# Patient Record
Sex: Male | Born: 1970 | Race: Black or African American | Hispanic: No | Marital: Married | Smoking: Never smoker
Health system: Southern US, Community
[De-identification: ages and names within clinical notes are randomized; demographics above are authoritative.]

## PROBLEM LIST (undated history)

## (undated) DIAGNOSIS — M7989 Other specified soft tissue disorders: Secondary | ICD-10-CM

## (undated) DIAGNOSIS — G4730 Sleep apnea, unspecified: Secondary | ICD-10-CM

## (undated) DIAGNOSIS — F32A Depression, unspecified: Secondary | ICD-10-CM

## (undated) DIAGNOSIS — K760 Fatty (change of) liver, not elsewhere classified: Secondary | ICD-10-CM

## (undated) DIAGNOSIS — E079 Disorder of thyroid, unspecified: Secondary | ICD-10-CM

## (undated) DIAGNOSIS — E039 Hypothyroidism, unspecified: Secondary | ICD-10-CM

## (undated) DIAGNOSIS — Z91018 Allergy to other foods: Secondary | ICD-10-CM

## (undated) DIAGNOSIS — F419 Anxiety disorder, unspecified: Secondary | ICD-10-CM

## (undated) DIAGNOSIS — E119 Type 2 diabetes mellitus without complications: Secondary | ICD-10-CM

## (undated) DIAGNOSIS — I10 Essential (primary) hypertension: Secondary | ICD-10-CM

## (undated) DIAGNOSIS — E559 Vitamin D deficiency, unspecified: Secondary | ICD-10-CM

## (undated) DIAGNOSIS — K219 Gastro-esophageal reflux disease without esophagitis: Secondary | ICD-10-CM

## (undated) DIAGNOSIS — J45909 Unspecified asthma, uncomplicated: Secondary | ICD-10-CM

## (undated) DIAGNOSIS — F329 Major depressive disorder, single episode, unspecified: Secondary | ICD-10-CM

## (undated) DIAGNOSIS — F319 Bipolar disorder, unspecified: Secondary | ICD-10-CM

## (undated) HISTORY — DX: Vitamin D deficiency, unspecified: E55.9

## (undated) HISTORY — DX: Unspecified asthma, uncomplicated: J45.909

## (undated) HISTORY — DX: Other specified soft tissue disorders: M79.89

## (undated) HISTORY — DX: Allergy to other foods: Z91.018

## (undated) HISTORY — PX: KNEE SURGERY: SHX244

## (undated) HISTORY — DX: Bipolar disorder, unspecified: F31.9

## (undated) HISTORY — DX: Hypothyroidism, unspecified: E03.9

## (undated) HISTORY — DX: Fatty (change of) liver, not elsewhere classified: K76.0

## (undated) HISTORY — DX: Gastro-esophageal reflux disease without esophagitis: K21.9

---

## 2001-02-17 ENCOUNTER — Emergency Department (HOSPITAL_COMMUNITY): Admission: EM | Admit: 2001-02-17 | Discharge: 2001-02-17 | Payer: Self-pay | Admitting: Internal Medicine

## 2001-03-09 ENCOUNTER — Ambulatory Visit (HOSPITAL_COMMUNITY): Admission: RE | Admit: 2001-03-09 | Discharge: 2001-03-09 | Payer: Self-pay | Admitting: Gastroenterology

## 2002-06-13 ENCOUNTER — Emergency Department (HOSPITAL_COMMUNITY): Admission: EM | Admit: 2002-06-13 | Discharge: 2002-06-13 | Payer: Self-pay | Admitting: Emergency Medicine

## 2002-06-13 ENCOUNTER — Encounter: Payer: Self-pay | Admitting: Emergency Medicine

## 2003-03-06 ENCOUNTER — Encounter: Payer: Self-pay | Admitting: Orthopedic Surgery

## 2003-03-06 ENCOUNTER — Ambulatory Visit (HOSPITAL_COMMUNITY): Admission: RE | Admit: 2003-03-06 | Discharge: 2003-03-06 | Payer: Self-pay | Admitting: Orthopedic Surgery

## 2003-05-16 ENCOUNTER — Encounter: Payer: Self-pay | Admitting: Internal Medicine

## 2003-05-16 ENCOUNTER — Encounter: Admission: RE | Admit: 2003-05-16 | Discharge: 2003-05-16 | Payer: Self-pay | Admitting: Internal Medicine

## 2003-06-17 ENCOUNTER — Ambulatory Visit (HOSPITAL_COMMUNITY): Admission: RE | Admit: 2003-06-17 | Discharge: 2003-06-17 | Payer: Self-pay | Admitting: Orthopedic Surgery

## 2003-07-03 ENCOUNTER — Encounter: Admission: RE | Admit: 2003-07-03 | Discharge: 2003-07-09 | Payer: Self-pay | Admitting: Orthopedic Surgery

## 2003-08-14 ENCOUNTER — Encounter: Payer: Self-pay | Admitting: Neurosurgery

## 2003-08-14 ENCOUNTER — Ambulatory Visit (HOSPITAL_COMMUNITY): Admission: RE | Admit: 2003-08-14 | Discharge: 2003-08-14 | Payer: Self-pay | Admitting: Neurosurgery

## 2003-08-27 ENCOUNTER — Encounter: Payer: Self-pay | Admitting: Neurosurgery

## 2003-08-27 ENCOUNTER — Encounter: Admission: RE | Admit: 2003-08-27 | Discharge: 2003-08-27 | Payer: Self-pay | Admitting: Neurosurgery

## 2003-09-11 ENCOUNTER — Encounter: Payer: Self-pay | Admitting: Neurosurgery

## 2003-09-11 ENCOUNTER — Encounter: Admission: RE | Admit: 2003-09-11 | Discharge: 2003-09-11 | Payer: Self-pay | Admitting: Neurosurgery

## 2003-10-09 ENCOUNTER — Encounter: Payer: Self-pay | Admitting: Internal Medicine

## 2003-10-09 ENCOUNTER — Ambulatory Visit (HOSPITAL_COMMUNITY): Admission: RE | Admit: 2003-10-09 | Discharge: 2003-10-09 | Payer: Self-pay | Admitting: Internal Medicine

## 2003-12-20 HISTORY — PX: KNEE SURGERY: SHX244

## 2004-08-24 ENCOUNTER — Ambulatory Visit (HOSPITAL_COMMUNITY): Admission: RE | Admit: 2004-08-24 | Discharge: 2004-08-24 | Payer: Self-pay | Admitting: Internal Medicine

## 2005-04-15 ENCOUNTER — Emergency Department (HOSPITAL_COMMUNITY): Admission: EM | Admit: 2005-04-15 | Discharge: 2005-04-16 | Payer: Self-pay | Admitting: Emergency Medicine

## 2005-04-17 ENCOUNTER — Emergency Department (HOSPITAL_COMMUNITY): Admission: EM | Admit: 2005-04-17 | Discharge: 2005-04-17 | Payer: Self-pay | Admitting: Emergency Medicine

## 2005-04-19 ENCOUNTER — Ambulatory Visit (HOSPITAL_COMMUNITY): Admission: RE | Admit: 2005-04-19 | Discharge: 2005-04-19 | Payer: Self-pay | Admitting: Urology

## 2005-09-13 IMAGING — CT CT ABDOMEN W/O CM
1 series · 15 of 32 positions shown, 19 images · non-contrast
Comparison: none

HISTORY: Left flank pain

[Series 2: stone_wo 5.0 b40f st · axial · 0.79mm/px · z∈[-490,-110]mm · 15 of 106 slices shown, 19 images]
[im 7/106  soft-tissue]
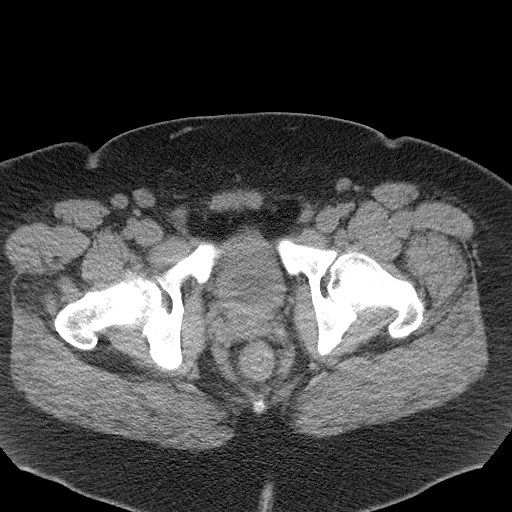
[im 7/106  bone]
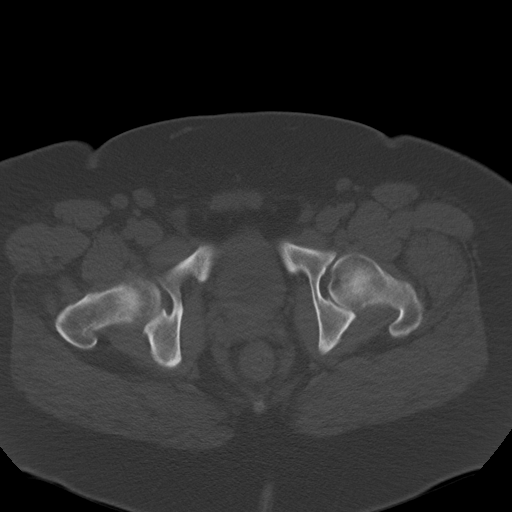
[im 14/106  soft-tissue]
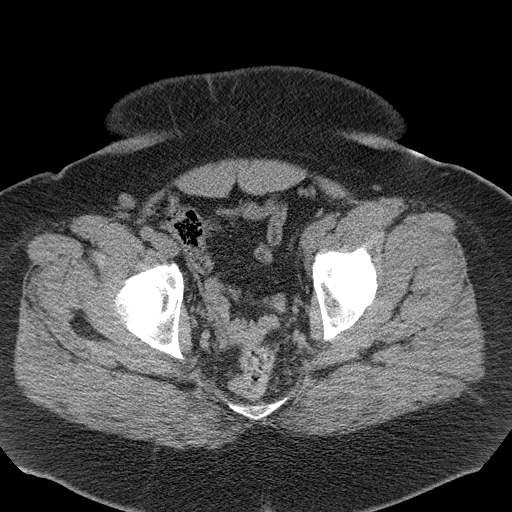
[im 21/106  soft-tissue]
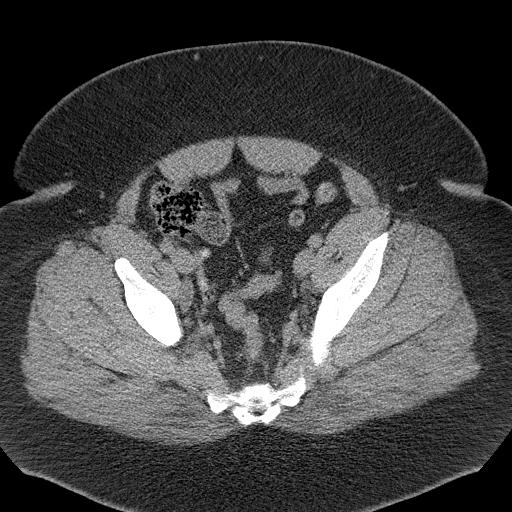
[im 31/106  soft-tissue]
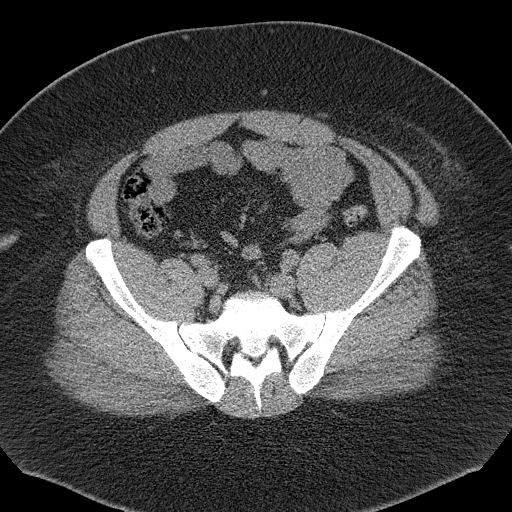
[im 38/106  soft-tissue]
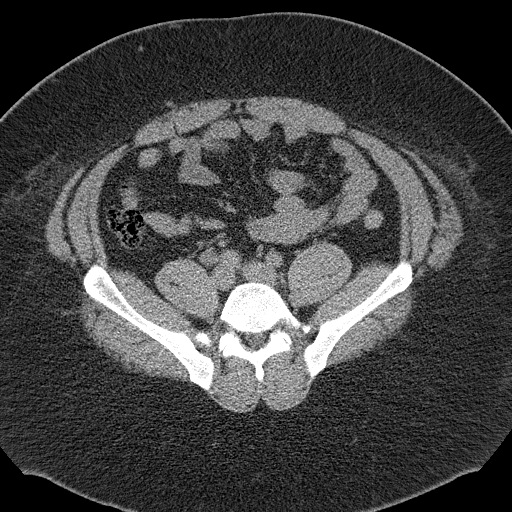
[im 45/106  soft-tissue]
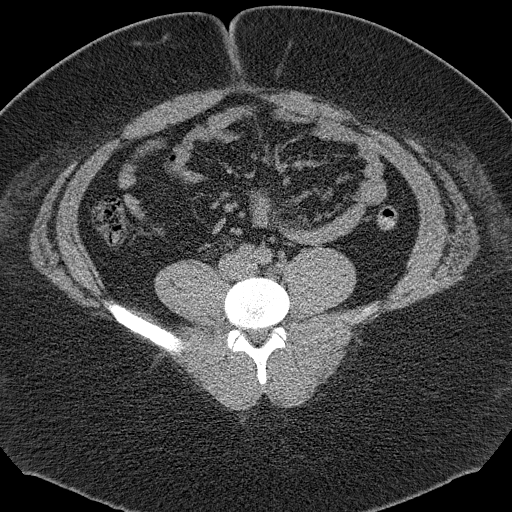
[im 55/106  soft-tissue]
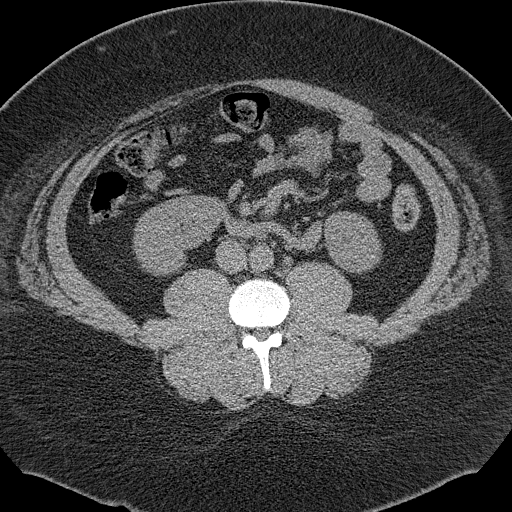
[im 61/106  soft-tissue]
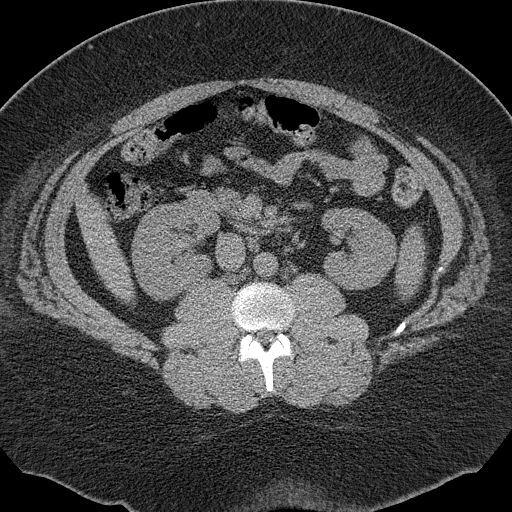
[im 68/106  soft-tissue]
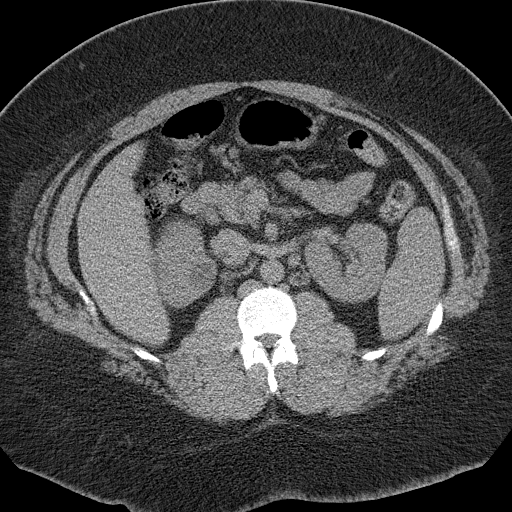
[im 68/106  bone]
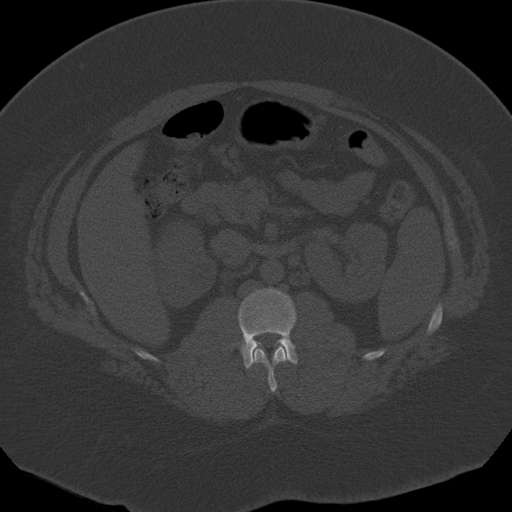
[im 75/106  soft-tissue]
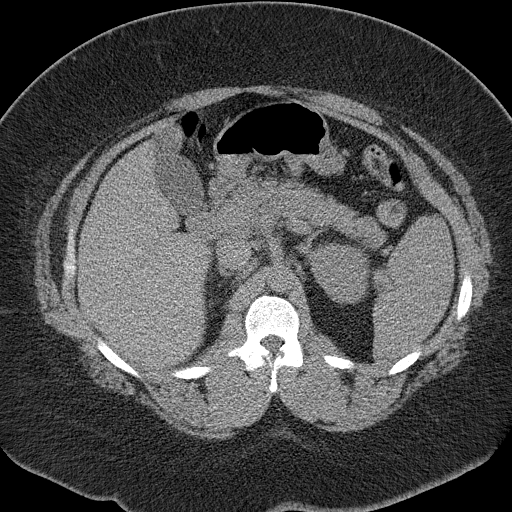
[im 85/106  soft-tissue]
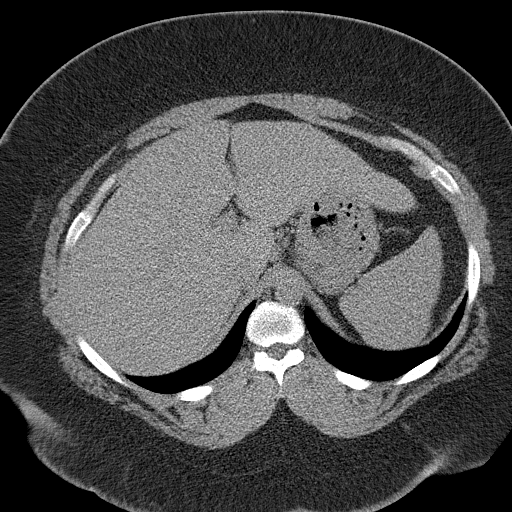
[im 92/106  soft-tissue]
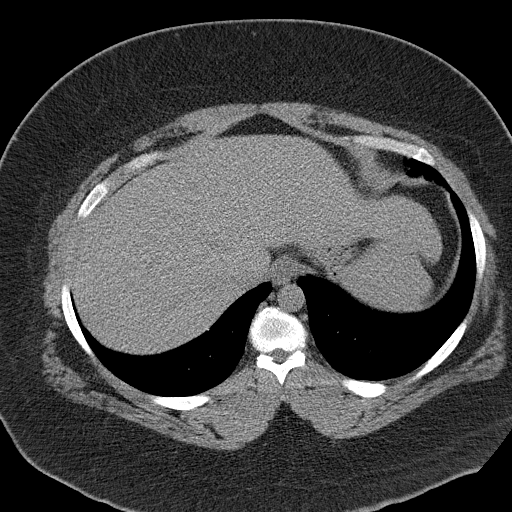
[im 92/106  lung]
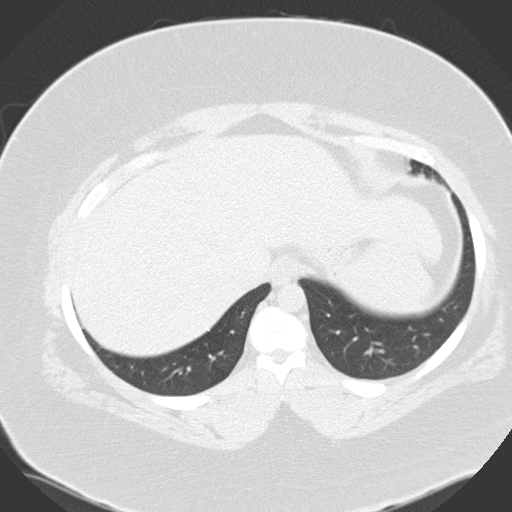
[im 95/106  lung]
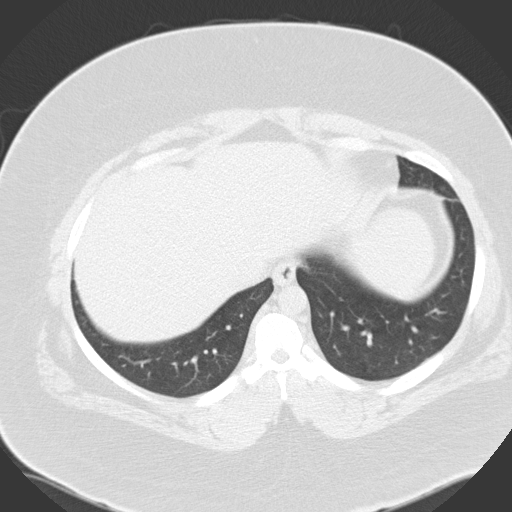
[im 99/106  soft-tissue]
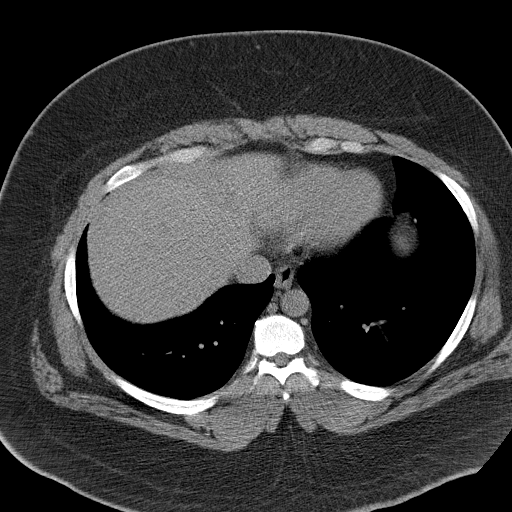
[im 99/106  lung]
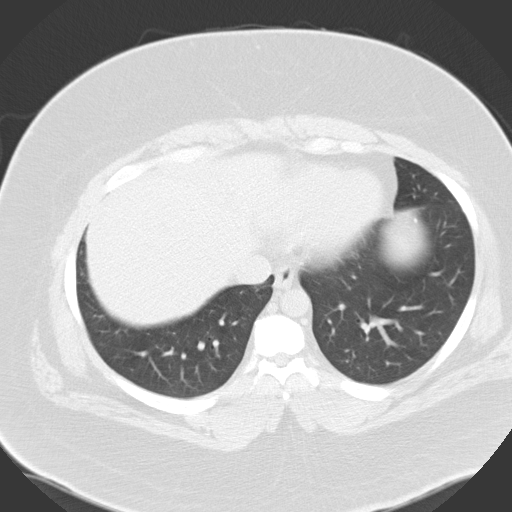
[im 102/106  lung]
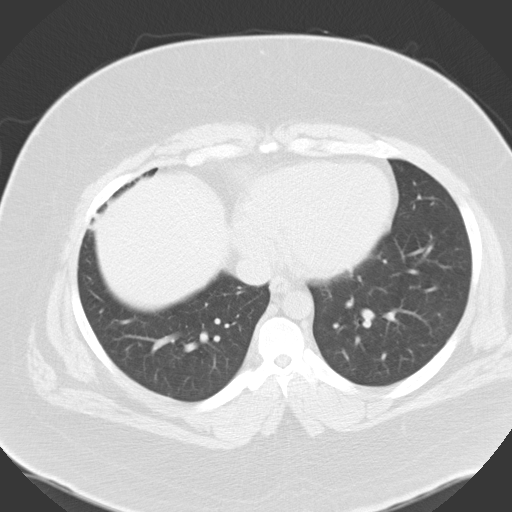

[15 of 32 positions shown; findings below may reference images not displayed]

CT ABDOMEN AND PELVIS WITHOUT CONTRAST:

Multidetector helical CT imaging of abdomen and pelvis performed using kidney
stone protocol. 
Neither oral nor intravenous contrast utilized for this indication.
No prior exam for comparison.

CT ABDOMEN:

Lung bases clear.
Image quality degraded by patient body habitus.
Vague low attenuation focus medial aspect of mid right kidney, 3.0 x 2.0 cm,
image 42, question cyst.
No gross evidence of urinary tract calcification, hydronephrosis, or ureteral
dilatation.
Minimally enlarged left periaortic lymph node, 1.4 x 1.1 cm, image 55.
Mild periportal adenopathy noted as well, largest node measuring 3.2 x 2.7 cm,
image 36.
Bowel loops and solid organs otherwise unremarkable for exam lacking IV and oral
contrast.
No ureteral calcification or dilatation.
IMPRESSION: Question right renal cyst, recommend followup ultrasound to confirm.
Periportal adenopathy of uncertain etiology. This can be seen with reactive
processes, metastatic disease, lymphoma, sarcoidosis, and other etiologies.
Correlation to patient's medical history recommended. If the patient has prior
outside exams, these would be of value in assessing stability. Remainder of
upper abdomen shows no significant abnormalities on non-contrast limited exam.
If prior studies cannot be obtained, recommend followup CT abdomen and pelvis
with IV and oral contrast to assess stability.

CT PELVIS:

Normal appendix.
No distal ureteral calcification or dilatation.
No pelvic mass, adenopathy, free fluid, or hernia.
Normal sized inguinal lymph nodes bilaterally.
IMPRESSION: No acute abnormalities.

## 2005-09-17 IMAGING — CT CT PELVIS W/ CM
1 of 5 series · 12 of 32 positions shown, 18 images · IV contrast (omnipaque)
Comparison: Renal ultrasound 08/24/04 and CT of the abdomen without contrast done 04/15/05.

CLINICAL DATA: Microhematuria and left flank pain.  Right renal cyst.
TECHNIQUE: Unenhanced images through the kidneys were obtained.  Subsequently, during the IV bolus injection of 150 cc of Omnipaque 300, multidetector helical CT imaging of the abdomen and pelvis was performed.  Oral contrast was given.

[Series 3: abd/pelvis 5.0 b30f · axial · 0.86mm/px · z∈[-384,+16]mm · 12 of 96 slices shown, 18 images]
[im 8/96  soft-tissue]
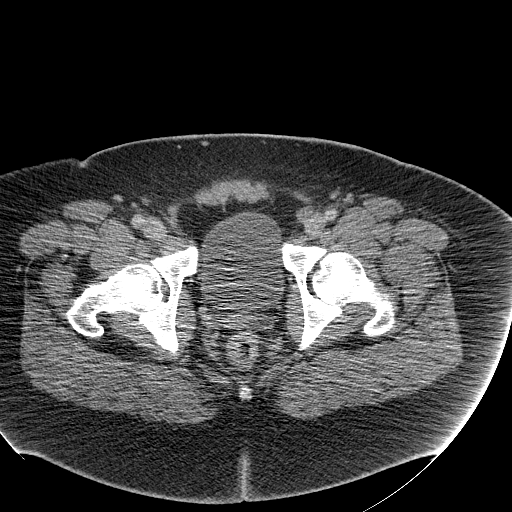
[im 8/96  bone]
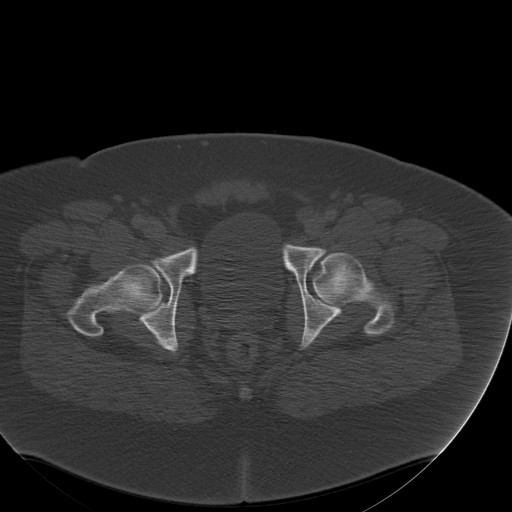
[im 15/96  soft-tissue]
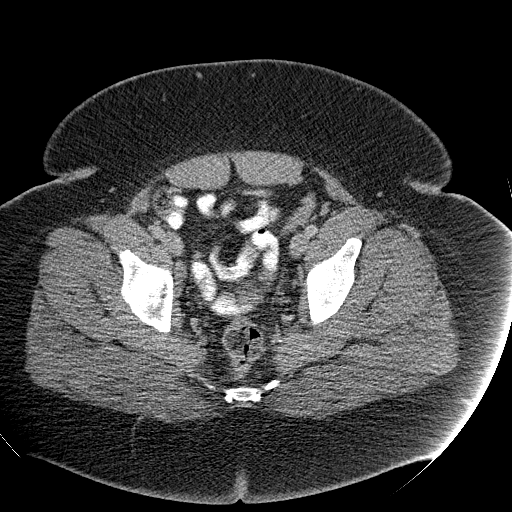
[im 22/96  soft-tissue]
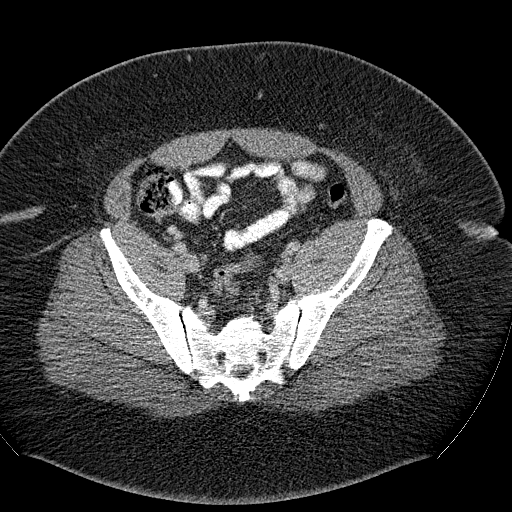
[im 30/96  soft-tissue]
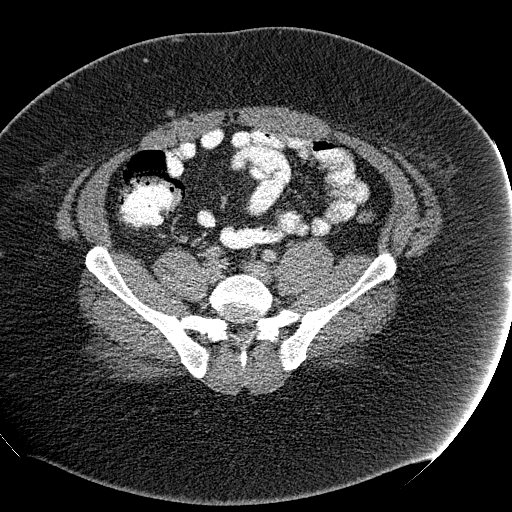
[im 37/96  soft-tissue]
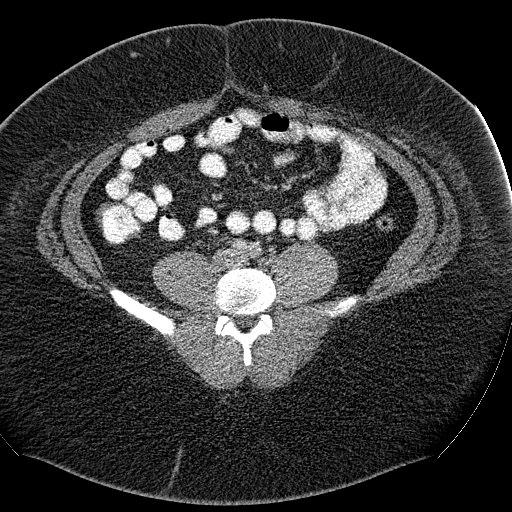
[im 44/96  soft-tissue]
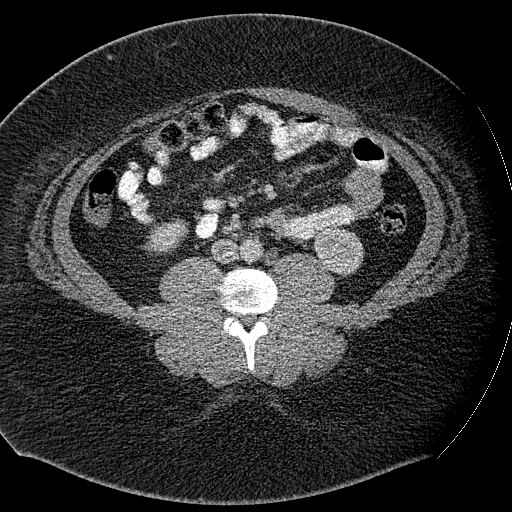
[im 52/96  soft-tissue]
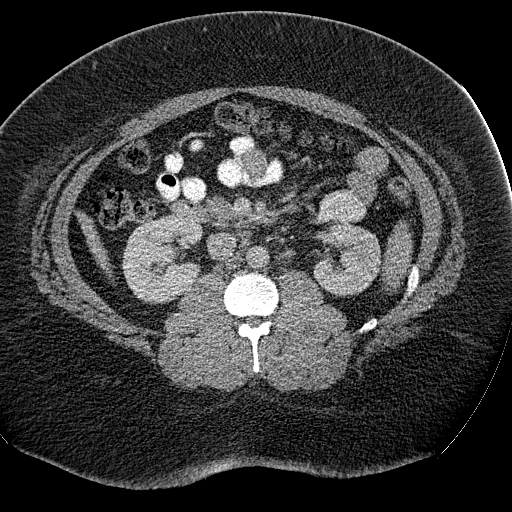
[im 59/96  soft-tissue]
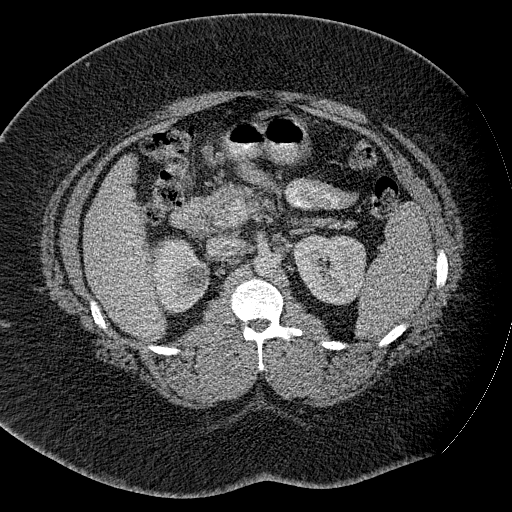
[im 66/96  soft-tissue]
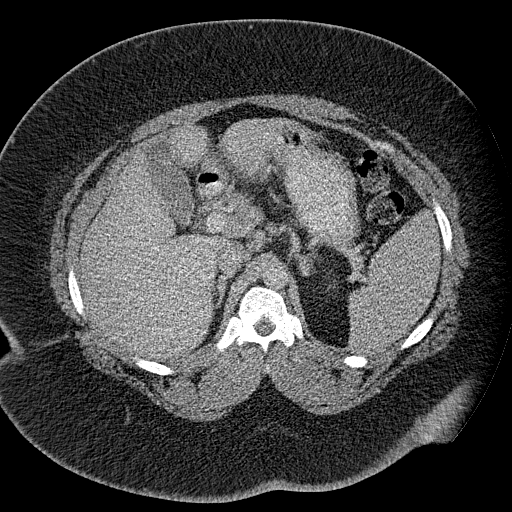
[im 66/96  lung]
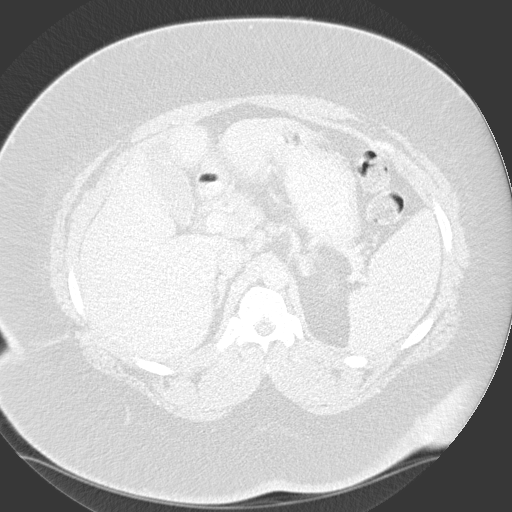
[im 66/96  bone]
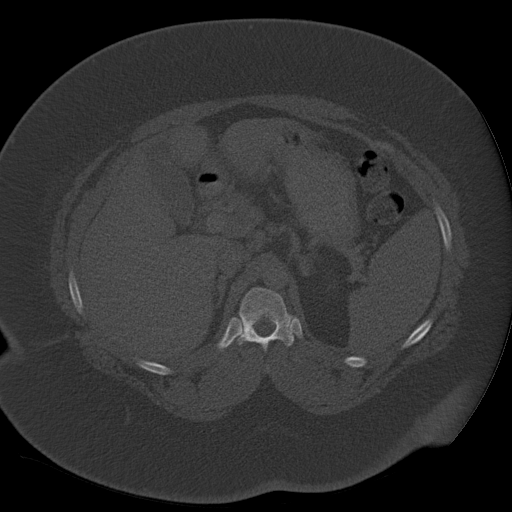
[im 74/96  soft-tissue]
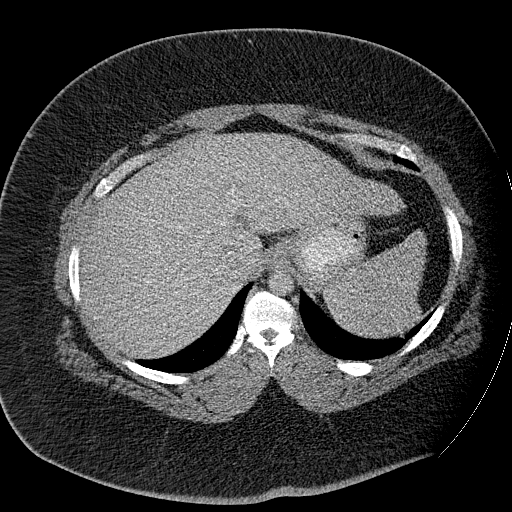
[im 74/96  lung]
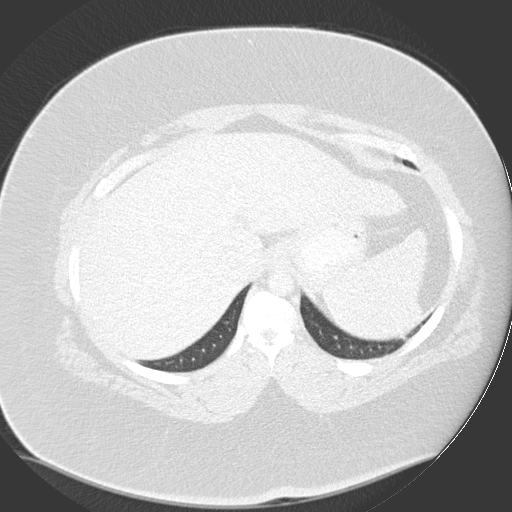
[im 81/96  soft-tissue]
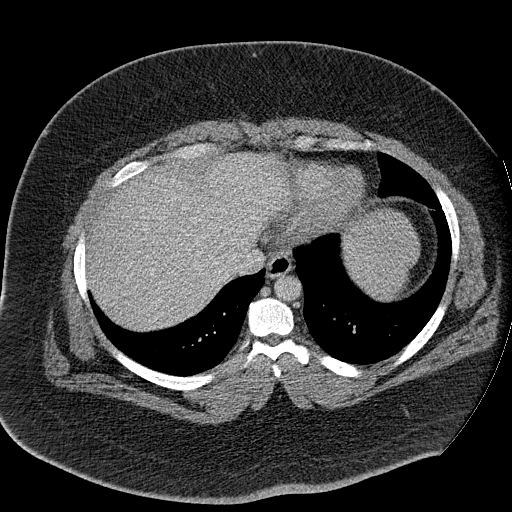
[im 81/96  lung]
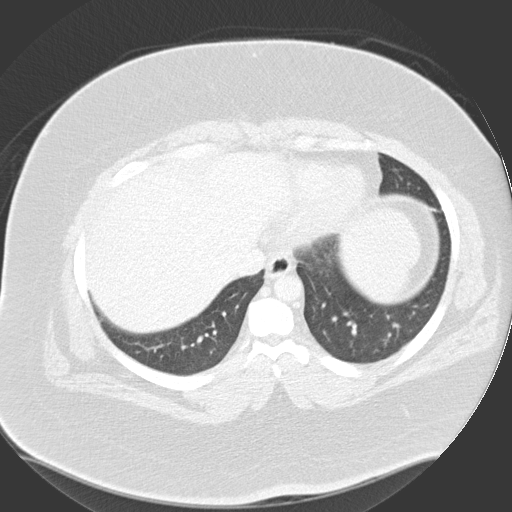
[im 88/96  soft-tissue]
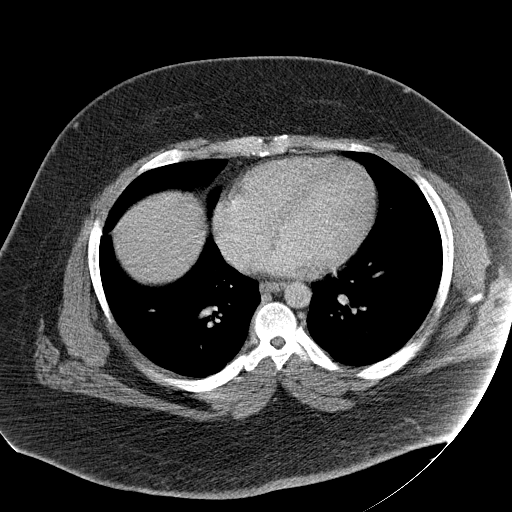
[im 88/96  lung]
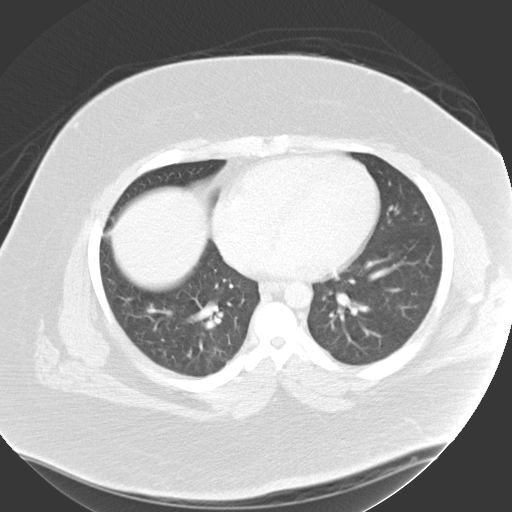

[12 of 32 positions shown; findings below may reference images not displayed]

CT OF THE ABDOMEN WITH AND WITHOUT CONTRAST:
The lung bases are clear.  There is no pleural effusion.  The precontrast images through the kidneys demonstrate no renal calcifications.  Posteriorly in the upper pole of the right kidney, there is a 2.5 x 2.8 cm low attenuation lesion.  This measures between 15 and 20 Hounsfield units before and after contrast administration and shows no apparent enhancement.  The patient had a cyst in the upper pole of the right kidney on an ultrasound done 08/24/04.  No enhancing renal masses are demonstrated.  There is no hydronephrosis.  Parenchymal detail is again limited by the patient?s size.  
The liver, spleen, adrenal glands, pancreas, and gallbladder demonstrate no abnormalities.  Again noted are several prominent lymph nodes in the porta hepatis and upper retroperitoneum.  These are unchanged from the recent unenhanced study of three days ago.  The largest node is in the Hideya caval space, measuring 2.2 cm in diameter on image 36.  No bowel lesions are identified.
IMPRESSION: 1.  Simple cyst in the upper pole of the right kidney demonstrates no abnormal enhancement.
2.  Nonspecific upper abdominal adenopathy as noted on recent unenhanced CT.  Once again, clinical correlation is necessary as this finding is nonspecific.  If etiology remains unknown clinically, followup CT in three to four months could be performed to assess stability. 
CT PELVIS WITH CONTRAST:
There is no pelvic mass, fluid collection, or inflammatory process.  No enlarged pelvic lymph nodes are seen.
IMPRESSION: No significant pelvic findings.

## 2006-02-07 ENCOUNTER — Emergency Department (HOSPITAL_COMMUNITY): Admission: EM | Admit: 2006-02-07 | Discharge: 2006-02-07 | Payer: Self-pay | Admitting: Family Medicine

## 2006-07-31 ENCOUNTER — Emergency Department (HOSPITAL_COMMUNITY): Admission: EM | Admit: 2006-07-31 | Discharge: 2006-07-31 | Payer: Self-pay | Admitting: Family Medicine

## 2006-10-03 ENCOUNTER — Emergency Department (HOSPITAL_COMMUNITY): Admission: EM | Admit: 2006-10-03 | Discharge: 2006-10-03 | Payer: Self-pay | Admitting: Family Medicine

## 2016-07-02 ENCOUNTER — Emergency Department (HOSPITAL_COMMUNITY): Payer: 59

## 2016-07-02 ENCOUNTER — Encounter (HOSPITAL_COMMUNITY): Payer: Self-pay | Admitting: Emergency Medicine

## 2016-07-02 ENCOUNTER — Observation Stay (HOSPITAL_COMMUNITY)
Admission: EM | Admit: 2016-07-02 | Discharge: 2016-07-04 | Disposition: A | Discharge status: Other Acute Inpatient Hospital | Payer: 59 | Attending: Internal Medicine | Admitting: Internal Medicine

## 2016-07-02 DIAGNOSIS — G4733 Obstructive sleep apnea (adult) (pediatric): Secondary | ICD-10-CM | POA: Insufficient documentation

## 2016-07-02 DIAGNOSIS — E039 Hypothyroidism, unspecified: Secondary | ICD-10-CM | POA: Diagnosis not present

## 2016-07-02 DIAGNOSIS — Z9989 Dependence on other enabling machines and devices: Secondary | ICD-10-CM | POA: Diagnosis not present

## 2016-07-02 DIAGNOSIS — G4730 Sleep apnea, unspecified: Secondary | ICD-10-CM | POA: Diagnosis present

## 2016-07-02 DIAGNOSIS — T424X2A Poisoning by benzodiazepines, intentional self-harm, initial encounter: Principal | ICD-10-CM | POA: Diagnosis present

## 2016-07-02 DIAGNOSIS — T413X2A Poisoning by local anesthetics, intentional self-harm, initial encounter: Secondary | ICD-10-CM | POA: Insufficient documentation

## 2016-07-02 DIAGNOSIS — E119 Type 2 diabetes mellitus without complications: Secondary | ICD-10-CM | POA: Diagnosis not present

## 2016-07-02 DIAGNOSIS — Z7984 Long term (current) use of oral hypoglycemic drugs: Secondary | ICD-10-CM | POA: Insufficient documentation

## 2016-07-02 DIAGNOSIS — T50902A Poisoning by unspecified drugs, medicaments and biological substances, intentional self-harm, initial encounter: Secondary | ICD-10-CM

## 2016-07-02 DIAGNOSIS — I10 Essential (primary) hypertension: Secondary | ICD-10-CM | POA: Diagnosis not present

## 2016-07-02 DIAGNOSIS — F3181 Bipolar II disorder: Secondary | ICD-10-CM | POA: Diagnosis not present

## 2016-07-02 HISTORY — DX: Disorder of thyroid, unspecified: E07.9

## 2016-07-02 HISTORY — DX: Major depressive disorder, single episode, unspecified: F32.9

## 2016-07-02 HISTORY — DX: Depression, unspecified: F32.A

## 2016-07-02 HISTORY — DX: Sleep apnea, unspecified: G47.30

## 2016-07-02 HISTORY — DX: Anxiety disorder, unspecified: F41.9

## 2016-07-02 HISTORY — DX: Essential (primary) hypertension: I10

## 2016-07-02 HISTORY — DX: Type 2 diabetes mellitus without complications: E11.9

## 2016-07-02 LAB — COMPREHENSIVE METABOLIC PANEL
ALT: 27 U/L (ref 17–63)
AST: 29 U/L (ref 15–41)
Albumin: 3.9 g/dL (ref 3.5–5.0)
Alkaline Phosphatase: 54 U/L (ref 38–126)
Anion gap: 7 (ref 5–15)
BILIRUBIN TOTAL: 1.2 mg/dL (ref 0.3–1.2)
BUN: 11 mg/dL (ref 6–20)
CHLORIDE: 102 mmol/L (ref 101–111)
CO2: 26 mmol/L (ref 22–32)
CREATININE: 0.76 mg/dL (ref 0.61–1.24)
Calcium: 8.9 mg/dL (ref 8.9–10.3)
Glucose, Bld: 148 mg/dL — ABNORMAL HIGH (ref 65–99)
Potassium: 4.3 mmol/L (ref 3.5–5.1)
Sodium: 135 mmol/L (ref 135–145)
TOTAL PROTEIN: 7.6 g/dL (ref 6.5–8.1)

## 2016-07-02 LAB — CBC
HCT: 46 % (ref 39.0–52.0)
Hemoglobin: 15.7 g/dL (ref 13.0–17.0)
MCH: 30.8 pg (ref 26.0–34.0)
MCHC: 34.1 g/dL (ref 30.0–36.0)
MCV: 90.4 fL (ref 78.0–100.0)
PLATELETS: 225 10*3/uL (ref 150–400)
RBC: 5.09 MIL/uL (ref 4.22–5.81)
RDW: 13.1 % (ref 11.5–15.5)
WBC: 9.4 10*3/uL (ref 4.0–10.5)

## 2016-07-02 LAB — BLOOD GAS, ARTERIAL
Acid-Base Excess: 0.5 mmol/L (ref 0.0–2.0)
BICARBONATE: 26.3 meq/L — AB (ref 20.0–24.0)
DELIVERY SYSTEMS: POSITIVE
Drawn by: 308601
O2 CONTENT: 12 L/min
O2 Saturation: 97.6 %
PCO2 ART: 48.3 mmHg — AB (ref 35.0–45.0)
PH ART: 7.355 (ref 7.350–7.450)
PO2 ART: 112 mmHg — AB (ref 80.0–100.0)
Patient temperature: 98.6
TCO2: 22.9 mmol/L (ref 0–100)

## 2016-07-02 LAB — ACETAMINOPHEN LEVEL: Acetaminophen (Tylenol), Serum: <10 ug/mL — ABNORMAL LOW (ref 10–30)

## 2016-07-02 LAB — SALICYLATE LEVEL

## 2016-07-02 LAB — ETHANOL

## 2016-07-02 MED ORDER — SODIUM CHLORIDE 0.9% FLUSH
3.0000 mL | Freq: Two times a day (BID) | INTRAVENOUS | Status: DC
  Administered 2016-07-02 – 2016-07-04 (×4): 3 mL via INTRAVENOUS

## 2016-07-02 MED ORDER — CHLORHEXIDINE GLUCONATE 0.12 % MT SOLN
15.0000 mL | Freq: Two times a day (BID) | OROMUCOSAL | Status: DC
  Administered 2016-07-03 – 2016-07-04 (×2): 15 mL via OROMUCOSAL
  Filled 2016-07-02: qty 15

## 2016-07-02 MED ORDER — CETYLPYRIDINIUM CHLORIDE 0.05 % MT LIQD
7.0000 mL | Freq: Two times a day (BID) | OROMUCOSAL | Status: DC
  Administered 2016-07-03 (×2): 7 mL via OROMUCOSAL

## 2016-07-02 MED ORDER — SODIUM CHLORIDE 0.9 % IV BOLUS (SEPSIS)
1000.0000 mL | Freq: Once | INTRAVENOUS | Status: AC
  Administered 2016-07-02: 1000 mL via INTRAVENOUS

## 2016-07-02 MED ORDER — ENOXAPARIN SODIUM 40 MG/0.4ML ~~LOC~~ SOLN: 40.0000 mg | SUBCUTANEOUS | Status: DC

## 2016-07-02 MED ORDER — ENOXAPARIN SODIUM 100 MG/ML ~~LOC~~ SOLN
90.0000 mg | Freq: Every day | SUBCUTANEOUS | Status: DC
  Administered 2016-07-03 – 2016-07-04 (×2): 90 mg via SUBCUTANEOUS
  Filled 2016-07-02 (×2): qty 1

## 2016-07-02 MED ORDER — SODIUM CHLORIDE 0.9 % IV SOLN
INTRAVENOUS | Status: DC
  Administered 2016-07-02 – 2016-07-03 (×2): via INTRAVENOUS

## 2016-07-02 NOTE — ED Notes (Signed)
Pt's 02 sat drops into 80's, snoring with pauses noted. RT at bedside for Bipap placement. Pt slower to open eyes at this time, no verbal response.

## 2016-07-02 NOTE — ED Notes (Signed)
Pt remains difficult to arouse but has not dropped oxygen saturation.

## 2016-07-02 NOTE — ED Provider Notes (Signed)
CSN: [id number]     Arrival date & time 07/02/16  2004 History   First MD Initiated Contact with Patient 07/02/16 2008     Chief Complaint  Patient presents with  . Drug Overdose     Patient is a 45 y.o. male presenting with Overdose. The history is provided by the patient and the EMS personnel. No language interpreter was used.  Drug Overdose   Vincent Ruiz is a 45 y.o. male who presents to the Emergency Department complaining of overdose.  Vincent Ruiz presents by EMS for evaluation for an overdose. His significant other found him lethargic this evening and called 911. He reports taking 15 20 mg Restoril about 6 PM today and attempted to kill himself. Per report patient had text messages on what to put it as a pituitary. He endorses SI. He denies any current complaints. He has a history of sleep apnea and uses CPAP at home.  Level V caveat due to psychiatric illness.  Past Medical History  Diagnosis Date  . Diabetes mellitus without complication (HCC)   . Hypertension   . Anxiety   . Depression   . Thyroid disease   . Sleep apnea    Past Surgical History  Procedure Laterality Date  . Knee surgery     History reviewed. No pertinent family history. Social History  Substance Use Topics  . Smoking status: Never Smoker   . Smokeless tobacco: None  . Alcohol Use: No    Review of Systems  Unable to perform ROS: Psychiatric disorder      Allergies  Penicillins  Home Medications   Prior to Admission medications   Medication Sig Start Date End Date Taking? Authorizing Provider  escitalopram (LEXAPRO) 20 MG tablet Take 20 mg by mouth daily.   Yes Historical Provider, MD  lamoTRIgine (LAMICTAL) 100 MG tablet Take 100 mg by mouth daily.   Yes Historical Provider, MD  levothyroxine (SYNTHROID, LEVOTHROID) 100 MCG tablet Take 100 mcg by mouth daily before breakfast.   Yes Historical Provider, MD  lisinopril-hydrochlorothiazide (PRINZIDE,ZESTORETIC) 10-12.5 MG tablet Take 1  tablet by mouth daily.   Yes Historical Provider, MD  metFORMIN (GLUCOPHAGE) 500 MG tablet Take 500 mg by mouth 2 (two) times daily with a meal.   Yes Historical Provider, MD  temazepam (RESTORIL) 15 MG capsule Take 15 mg by mouth at bedtime as needed for sleep.   Yes Historical Provider, MD  testosterone cypionate (DEPOTESTOSTERONE CYPIONATE) 200 MG/ML injection Inject 100 mg into the muscle once a week.   Yes Historical Provider, MD  traMADol (ULTRAM) 50 MG tablet Take 50 mg by mouth every 6 (six) hours as needed for moderate pain or severe pain.   Yes Historical Provider, MD  VITAMIN D, CHOLECALCIFEROL, PO Take 1 capsule by mouth daily.   Yes Historical Provider, MD   BP 134/97 mmHg  Pulse 79  Temp(Src) 98.4 F (36.9 C) (Oral)  Resp 20  Ht 6\' 2"  (1.88 m)  Wt 409 lb 2.8 oz (185.6 kg)  BMI 52.51 kg/m2  SpO2 98% Physical Exam  Constitutional: He is oriented to person, place, and time. He appears well-developed and well-nourished.  HENT:  Head: Normocephalic and atraumatic.  Cardiovascular: Normal rate and regular rhythm.   No murmur heard. Pulmonary/Chest: Effort normal and breath sounds normal. No respiratory distress.  Abdominal: Soft. There is no tenderness. There is no rebound and no guarding.  Musculoskeletal: He exhibits no tenderness.  There is a removable fracture boot to the left  foot and ankle.  Neurological: He is oriented to person, place, and time.  Drowsy but arouses to verbal stimuli.  Skin: Skin is warm and dry.  Psychiatric: He has a normal mood and affect. His behavior is normal.  Nursing note and vitals reviewed.   ED Course  Procedures (including critical care time) Labs Review Labs Reviewed  COMPREHENSIVE METABOLIC PANEL - Abnormal; Notable for the following:    Glucose, Bld 148 (*)    All other components within normal limits  ACETAMINOPHEN LEVEL - Abnormal; Notable for the following:    Acetaminophen (Tylenol), Serum <10 (*)    All other components  within normal limits  BLOOD GAS, ARTERIAL - Abnormal; Notable for the following:    pCO2 arterial 48.3 (*)    pO2, Arterial 112 (*)    Bicarbonate 26.3 (*)    All other components within normal limits  MRSA PCR SCREENING  ETHANOL  SALICYLATE LEVEL  CBC  URINE RAPID DRUG SCREEN, HOSP PERFORMED  CBC WITH DIFFERENTIAL/PLATELET  COMPREHENSIVE METABOLIC PANEL    Imaging Review Dg Chest Port 1 View  07/02/2016  CLINICAL DATA:  45 year old male status post overdose, suicide attempt. Initial encounter. EXAM: PORTABLE CHEST 1 VIEW COMPARISON:  CT Abdomen and Pelvis 04/19/2005 FINDINGS: Portable AP semi upright view at 2026 hours. Normal cardiac size and mediastinal contours. Visualized tracheal air column is within normal limits. Low normal lung volumes. Allowing for portable technique, the lungs are clear. No pneumothorax or pleural effusion. IMPRESSION: No acute cardiopulmonary abnormality. Electronically Signed   By: Odessa Fleming M.D.   On: 07/02/2016 20:47   I have personally reviewed and evaluated these images and lab results as part of my medical decision-making.   EKG Interpretation   Date/Time:  Saturday July 02 2016 20:29:30 EDT Ventricular Rate:  93 PR Interval:    QRS Duration: 86 QT Interval:  355 QTC Calculation: 442 R Axis:   30 Text Interpretation:  Sinus rhythm Confirmed by Lincoln Brigham [phone]) on  07/02/2016 8:32:21 PM      MDM   Final diagnoses:  Drug overdose, intentional self-harm, initial encounter (HCC)   Pt here following overdose on restoril.  Pt is somnolent on exam but awakens to verbal stimuli.  He has OSA and was started on his home CPAP given somnolence.  He does have some hypoxia as well and was started on supplemental oxygen.  Plan to admit to medicine service for observation and well decrease supplemental oxygen as tolerated.     Tilden Fossa, MD 07/03/16 0010

## 2016-07-02 NOTE — ED Notes (Signed)
02 by Bipap turned down to 10L by Dot BeenJessica,RT per MD

## 2016-07-02 NOTE — ED Notes (Signed)
Bed: RESB Expected date:  Expected time:  Means of arrival:  Comments: 38 OD

## 2016-07-02 NOTE — ED Notes (Signed)
Pt less responsive at this time, [place]. Pt currently on Bipap with 12L. Jessica RT at bedside for ABG

## 2016-07-02 NOTE — ED Notes (Addendum)
Pt transported from home by EMS, pt is currently on disability for fx leg, pt states he was depressed and tired of dealing with life. Pt admitted SI to [facility], EMS did read text messages on what to put in obituary. Pt reports to EMS he took Temazepam 15mg  x 20 at 1800 today in suicide attempt.  Pt reports he also took Ultram 50 mg x 2 at 1500, Levothyroxine 100mcg this am. Pt states he is suppose to be taking Lamictal as well but does not take a prescribed. Denies etoh/drugs/smoking

## 2016-07-02 NOTE — ED Notes (Signed)
Charge RN in ICU states room will be ready in , I will return call at this time

## 2016-07-02 NOTE — Progress Notes (Signed)
Rx Brief Lovenox note  Wt=185 kg, BMI=52, CrCl~210 ml/min  Rx adjusted Lovenox to 90mg  daily (~0.5 mg/kg) in pt with BMI>30  Thanks Lorenza EvangelistGreen, Maylen Waltermire R 07/02/2016 11:59 PM

## 2016-07-02 NOTE — H&P (Signed)
History and Physical    Vincent Ruiz ZOX:[id number] DOB: September 15, 1971 DOA: 07/02/2016  PCP: No primary care provider on file.   Patient coming from: Home.  Chief Complaint: Temazepam overdose.  HPI: Vincent Ruiz is a 45 y.o. male with medical history significant of type 2 diabetes, hypertension, morbid obesity, anxiety, depression, hypothyroidism, obstructive sleep apnea on CPAP who was brought to the emergency department via EMS after ingesting 300 mg of temazepam around 1800 with suicidal purposes. It is also reported that the patient took 100 mg of Ultram around 1500. Patient left the text messages, that were read by EMS, in what was supposed to be written in his orbituary. No further history available at this time.   ED Course: The patient was started on CPAP ventilation and has tolerated well.  Review of Systems: As per HPI otherwise 10 point review of systems negative.    Past Medical History  Diagnosis Date  . Diabetes mellitus without complication (HCC)   . Hypertension   . Anxiety   . Depression   . Thyroid disease   . Sleep apnea     Past Surgical History  Procedure Laterality Date  . Knee surgery       reports that he has never smoked. He does not have any smokeless tobacco history on file. He reports that he does not drink alcohol or use illicit drugs.  Allergies  Allergen Reactions  . Penicillins Other (See Comments)    Has patient had a PCN reaction causing immediate rash, facial/tongue/throat swelling, SOB or lightheadedness with hypotension: Yes Has patient had a PCN reaction causing severe rash involving mucus membranes or skin necrosis: Yes Has patient had a PCN reaction that required hospitalization Yes Has patient had a PCN reaction occurring within the last 10 years: No If all of the above answers are "NO", then may proceed with Cephalosporin use.     History reviewed. No pertinent family history.   Prior to Admission medications     Medication Sig Start Date End Date Taking? Authorizing Provider  escitalopram (LEXAPRO) 20 MG tablet Take 20 mg by mouth daily.   Yes Historical Provider, MD  lamoTRIgine (LAMICTAL) 100 MG tablet Take 100 mg by mouth daily.   Yes Historical Provider, MD  levothyroxine (SYNTHROID, LEVOTHROID) 100 MCG tablet Take 100 mcg by mouth daily before breakfast.   Yes Historical Provider, MD  lisinopril-hydrochlorothiazide (PRINZIDE,ZESTORETIC) 10-12.5 MG tablet Take 1 tablet by mouth daily.   Yes Historical Provider, MD  metFORMIN (GLUCOPHAGE) 500 MG tablet Take 500 mg by mouth 2 (two) times daily with a meal.   Yes Historical Provider, MD  temazepam (RESTORIL) 15 MG capsule Take 15 mg by mouth at bedtime as needed for sleep.   Yes Historical Provider, MD  testosterone cypionate (DEPOTESTOSTERONE CYPIONATE) 200 MG/ML injection Inject 100 mg into the muscle once a week.   Yes Historical Provider, MD  traMADol (ULTRAM) 50 MG tablet Take 50 mg by mouth every 6 (six) hours as needed for moderate pain or severe pain.   Yes Historical Provider, MD  VITAMIN D, CHOLECALCIFEROL, PO Take 1 capsule by mouth daily.   Yes Historical Provider, MD    Physical Exam: Filed Vitals:   07/02/16 2106 07/02/16 2130 07/02/16 2200 07/02/16 2235  BP: 130/82 117/75 112/88 116/79  Pulse: 96 85 86 85  Temp:      TempSrc:      Resp: SpO2: 99% 97% 96% 95%  Constitutional: Sedated. Filed Vitals:   07/02/16 2106 07/02/16 2130 07/02/16 2200 07/02/16 2235  BP: 130/82 117/75 112/88 116/79  Pulse: 96 85 86 85  Temp:      TempSrc:      Resp: 20 22 22 25   SpO2: 99% 97% 96% 95%   Eyes: PERRL, lids and conjunctivae normal ENMT: CPAP mask is on. Neck: normal, supple, no masses, no thyromegaly Respiratory: clear to auscultation bilaterally, no wheezing, no crackles. Normal respiratory effort. No accessory muscle use.  Cardiovascular: Regular rate and rhythm, no murmurs / rubs / gallops. No extremity edema. 2+  pedal pulses. No carotid bruits.  Abdomen: Obese, soft, Bowel sounds positive. no tenderness, no masses palpated.  Musculoskeletal: no clubbing / cyanosis. No joint deformity upper and lower extremities. Good ROM, no contractures. Relaxed muscle tone Skin: no rashes, lesions, ulcers. No induration Neurologic: Sedated. Psychiatric: Somnolent, awakes briefly with tactile stimulation, then goes back to sleep.   Labs on Admission: I have personally reviewed following labs and imaging studies  CBC:  Recent Labs Lab 07/02/16 2041  WBC 9.4  HGB 15.7  HCT 46.0  MCV 90.4  PLT 225   Basic Metabolic Panel:  Recent Labs Lab 07/02/16 2041  NA 135  K 4.3  CL 102  CO2 26  GLUCOSE 148*  BUN 11  CREATININE 0.76  CALCIUM 8.9   GFR: CrCl cannot be calculated (Unknown ideal weight.). Liver Function Tests:  Recent Labs Lab 07/02/16 2041  AST 29  ALT 27  ALKPHOS 54  BILITOT 1.2  PROT 7.6  ALBUMIN 3.9    Radiological Exams on Admission: Dg Chest Port 1 View  07/02/2016  CLINICAL DATA:  45 year old male status post overdose, suicide attempt. Initial encounter. EXAM: PORTABLE CHEST 1 VIEW COMPARISON:  CT Abdomen and Pelvis 04/19/2005 FINDINGS: Portable AP semi upright view at 2026 hours. Normal cardiac size and mediastinal contours. Visualized tracheal air column is within normal limits. Low normal lung volumes. Allowing for portable technique, the lungs are clear. No pneumothorax or pleural effusion. IMPRESSION: No acute cardiopulmonary abnormality. Electronically Signed   By: Odessa FlemingH  Hall M.D.   On: 07/02/2016 20:47    EKG: Independently reviewed.  Vent. rate 93 BPM PR interval * ms QRS duration 86 ms QT/QTc 355/442 ms P-R-T axes 53 30 13 Sinus rhythm  Assessment/Plan Principal Problem:   Intentional benzodiazepine overdose (HCC) Admit to stepdown/observation. Continue CPAP ventilation and supplemental oxygen. Consult behavioral health once the patient is more alert.  Active  Problems:   Hypertension Recent antihypertensives once the patient is more alert. Monitor blood pressure.    Type 2 diabetes mellitus (HCC) Resume metformin and carbohydrate modified diet once the patient is awake. CBG monitoring before meals.    Hypothyroidism Resume levothyroxine once the patient is awake. Check TSH level in the morning.    Sleep apnea on CPAP Continue CPAP ventilation.   CODE STATUS: Full code. DVT prophylaxis: Lovenox SQ. Disposition: Monitor closely and consult psychiatry once the patient is more alert. Consults called:  Admission status: Observation/stepdown   Bobette Moavid Manuel Georganna Maxson MD [facility] Pager [phone]   If 7PM-7AM, please contact night-coverage [email or web address] Password [email or web address]  07/02/2016, 11:00 PM

## 2016-07-03 DIAGNOSIS — R45851 Suicidal ideations: Secondary | ICD-10-CM

## 2016-07-03 DIAGNOSIS — I10 Essential (primary) hypertension: Secondary | ICD-10-CM

## 2016-07-03 DIAGNOSIS — F3181 Bipolar II disorder: Secondary | ICD-10-CM | POA: Diagnosis not present

## 2016-07-03 DIAGNOSIS — T424X2A Poisoning by benzodiazepines, intentional self-harm, initial encounter: Secondary | ICD-10-CM

## 2016-07-03 DIAGNOSIS — E039 Hypothyroidism, unspecified: Secondary | ICD-10-CM | POA: Diagnosis not present

## 2016-07-03 LAB — COMPREHENSIVE METABOLIC PANEL
ALBUMIN: 3.7 g/dL (ref 3.5–5.0)
ALK PHOS: 52 U/L (ref 38–126)
ALT: 25 U/L (ref 17–63)
AST: 24 U/L (ref 15–41)
Anion gap: 5 (ref 5–15)
BUN: 11 mg/dL (ref 6–20)
CHLORIDE: 105 mmol/L (ref 101–111)
CO2: 27 mmol/L (ref 22–32)
CREATININE: 0.84 mg/dL (ref 0.61–1.24)
Calcium: 8.6 mg/dL — ABNORMAL LOW (ref 8.9–10.3)
GFR calc non Af Amer: >60 mL/min (ref >60)
GLUCOSE: 172 mg/dL — AB (ref 65–99)
Potassium: 4.4 mmol/L (ref 3.5–5.1)
SODIUM: 137 mmol/L (ref 135–145)
Total Bilirubin: 1.3 mg/dL — ABNORMAL HIGH (ref 0.3–1.2)
Total Protein: 7.1 g/dL (ref 6.5–8.1)

## 2016-07-03 LAB — CBC WITH DIFFERENTIAL/PLATELET
BASOS ABS: 0 10*3/uL (ref 0.0–0.1)
BASOS PCT: 0 %
EOS ABS: 0.1 10*3/uL (ref 0.0–0.7)
EOS PCT: 2 %
HCT: 43.3 % (ref 39.0–52.0)
HEMOGLOBIN: 14.9 g/dL (ref 13.0–17.0)
LYMPHS ABS: 1.6 10*3/uL (ref 0.7–4.0)
Lymphocytes Relative: 21 %
MCH: 31.4 pg (ref 26.0–34.0)
MCHC: 34.4 g/dL (ref 30.0–36.0)
MCV: 91.2 fL (ref 78.0–100.0)
Monocytes Absolute: 0.8 10*3/uL (ref 0.1–1.0)
Monocytes Relative: 10 %
NEUTROS PCT: 67 %
Neutro Abs: 5.3 10*3/uL (ref 1.7–7.7)
PLATELETS: 220 10*3/uL (ref 150–400)
RBC: 4.75 MIL/uL (ref 4.22–5.81)
RDW: 13.3 % (ref 11.5–15.5)
WBC: 7.8 10*3/uL (ref 4.0–10.5)

## 2016-07-03 LAB — RAPID URINE DRUG SCREEN, HOSP PERFORMED
Amphetamines: NOT DETECTED
BARBITURATES: NOT DETECTED
Benzodiazepines: POSITIVE — AB
COCAINE: NOT DETECTED
OPIATES: NOT DETECTED
Tetrahydrocannabinol: NOT DETECTED

## 2016-07-03 LAB — TSH: TSH: 1.463 u[IU]/mL (ref 0.350–4.500)

## 2016-07-03 LAB — MRSA PCR SCREENING: MRSA by PCR: NEGATIVE

## 2016-07-03 MED ORDER — MENTHOL 3 MG MT LOZG
1.0000 | LOZENGE | OROMUCOSAL | Status: DC | PRN
  Administered 2016-07-03 – 2016-07-04 (×3): 3 mg via ORAL
  Filled 2016-07-03 (×2): qty 9

## 2016-07-03 MED ORDER — LISINOPRIL-HYDROCHLOROTHIAZIDE 10-12.5 MG PO TABS: 1.0000 | ORAL_TABLET | Freq: Every day | ORAL | Status: DC

## 2016-07-03 MED ORDER — ESCITALOPRAM OXALATE 20 MG PO TABS
20.0000 mg | ORAL_TABLET | Freq: Every day | ORAL | Status: DC
Start: 2016-07-03 — End: 2016-07-04
  Administered 2016-07-03 – 2016-07-04 (×2): 20 mg via ORAL
  Filled 2016-07-03 (×2): qty 1

## 2016-07-03 MED ORDER — LEVOTHYROXINE SODIUM 100 MCG PO TABS
100.0000 ug | ORAL_TABLET | Freq: Every day | ORAL | Status: DC
Start: 2016-07-04 — End: 2016-07-04
  Administered 2016-07-04: 100 ug via ORAL
  Filled 2016-07-03 (×2): qty 1

## 2016-07-03 MED ORDER — LAMOTRIGINE 100 MG PO TABS
100.0000 mg | ORAL_TABLET | Freq: Every day | ORAL | Status: DC
  Administered 2016-07-03 – 2016-07-04 (×2): 100 mg via ORAL
  Filled 2016-07-03 (×2): qty 1

## 2016-07-03 MED ORDER — HYDROCHLOROTHIAZIDE 12.5 MG PO CAPS
12.5000 mg | ORAL_CAPSULE | Freq: Every day | ORAL | Status: DC
  Administered 2016-07-03: 12.5 mg via ORAL
  Filled 2016-07-03: qty 1

## 2016-07-03 MED ORDER — LISINOPRIL 10 MG PO TABS
10.0000 mg | ORAL_TABLET | Freq: Every day | ORAL | Status: DC
  Administered 2016-07-03: 10 mg via ORAL
  Filled 2016-07-03: qty 1

## 2016-07-03 MED ORDER — TRAZODONE HCL 100 MG PO TABS: 100.0000 mg | ORAL_TABLET | Freq: Every evening | ORAL | Status: DC | PRN

## 2016-07-03 MED ORDER — METFORMIN HCL 500 MG PO TABS
500.0000 mg | ORAL_TABLET | Freq: Two times a day (BID) | ORAL | Status: DC
  Administered 2016-07-03 – 2016-07-04 (×2): 500 mg via ORAL
  Filled 2016-07-03 (×2): qty 1

## 2016-07-03 MED ORDER — CEPASTAT 14.5 MG MT LOZG
1.0000 | LOZENGE | OROMUCOSAL | Status: DC | PRN
  Filled 2016-07-03: qty 9

## 2016-07-03 NOTE — Progress Notes (Signed)
PROGRESS NOTE  Vincent Ruiz:[id number] DOB: 06/02/1971 DOA: 07/02/2016 PCP: No primary care provider on file.     Brief Narrative: 45 y.o. male with medical history significant of type 2 diabetes, hypertension, morbid obesity, anxiety, depression, hypothyroidism, obstructive sleep apnea on CPAP who was brought to the emergency department via EMS after ingesting 300 mg of temazepam around 1800 with suicidal purposes. It is also reported that the patient took 100 mg of Ultram around 1500. Patient left the text messages, that were read by EMS, in what was supposed to be written in his orbituary.   Assessment & Plan: Principal Problem:   Intentional benzodiazepine overdose (HCC) Active Problems:   Hypertension   Type 2 diabetes mellitus (HCC)   Hypothyroidism   Sleep apnea on CPAP    Intentional benzodiazepine overdose (HCC) - stable this morning, more alert. Admits to SI, denies attempting this in the past however has had suicidal ideation in the past - psychiatry consulted, appreciate input.   Hypertension - resume antihypertensives today  Type 2 diabetes mellitus (HCC) - Resume metformin  Hypothyroidism - Resume levothyroxine today   Sleep apnea on CPAP - Continue CPAP ventilation.    DVT prophylaxis: Lovenox Code Status: Full Family Communication: no family bedside Disposition Plan: stable medically by tomorrow.   Consultants:   Psychiatry   Procedures:   None   Antimicrobials:  None    Subjective: - alert, in no pain. CPAP on. Breathing comfortably.  Objective: Filed Vitals:   07/03/16 0400 07/03/16 0500 07/03/16 0600 07/03/16 0700  BP: 118/69  122/84 123/73  Pulse: 69 69 67 68  Temp: 97.3 F (36.3 C)     TempSrc: Axillary     Resp: Height:      Weight:      SpO2: 98% 100% 100% 100%    Intake/Output Summary (Last 24 hours) at 07/03/16 1049 Last data filed at 07/03/16 0529  Gross per 24 hour  Intake    750 ml  Output     801 ml  Net    -51 ml   Filed Weights   07/02/16 2349  Weight: 185.6 kg (409 lb 2.8 oz)    Examination: Constitutional: NAD. Obese Filed Vitals:   07/03/16 0400 07/03/16 0500 07/03/16 0600 07/03/16 0700  BP: 118/69  122/84 123/73  Pulse: 69 69 67 68  Temp: 97.3 F (36.3 C)     TempSrc: Axillary     Resp: Height:      Weight:      SpO2: 98% 100% 100% 100%   Eyes: PERRL ENMT: Mucous membranes are moist. No oropharyngeal exudates Respiratory: clear to auscultation bilaterally, no wheezing, no crackles. Normal respiratory effort. No accessory muscle use.  Cardiovascular: Regular rate and rhythm, no murmurs / rubs / gallops. No LE edema. 2+ pedal pulses. No carotid bruits.  Abdomen: no tenderness. Bowel sounds positive.  Musculoskeletal: no clubbing / cyanosis.     Data Reviewed: I have personally reviewed following labs and imaging studies  CBC:  Recent Labs Lab 07/02/16 2041 07/03/16 0307  WBC 9.4 7.8  NEUTROABS  --  5.3  HGB 15.7 14.9  HCT 46.0 43.3  MCV 90.4 91.2  PLT 225 220   Basic Metabolic Panel:  Recent Labs Lab 07/02/16 2041 07/03/16 0307  NA 135 137  K 4.3 4.4  CL 102 105  CO2 26 27  GLUCOSE 148* 172*  BUN 11 11  CREATININE 0.76 0.84  CALCIUM 8.9 8.6*   GFR: Estimated Creatinine Clearance: 200.3 mL/min (by C-G formula based on Cr of 0.84). Liver Function Tests:  Recent Labs Lab 07/02/16 2041 07/03/16 0307  AST 29 24  ALT 27 25  ALKPHOS 54 52  BILITOT 1.2 1.3*  PROT 7.6 7.1  ALBUMIN 3.9 3.7   No results for input(s): LIPASE, AMYLASE in the last 168 hours. No results for input(s): AMMONIA in the last 168 hours. Coagulation Profile: No results for input(s): INR, PROTIME in the last 168 hours. Cardiac Enzymes: No results for input(s): CKTOTAL, CKMB, CKMBINDEX, TROPONINI in the last 168 hours. BNP (last 3 results) No results for input(s): PROBNP in the last 8760 hours. HbA1C: No results for input(s): HGBA1C in the last 72  hours. CBG: No results for input(s): GLUCAP in the last 168 hours. Lipid Profile: No results for input(s): CHOL, HDL, LDLCALC, TRIG, CHOLHDL, LDLDIRECT in the last 72 hours. Thyroid Function Tests:  Recent Labs  07/03/16 0307  TSH 1.463   Anemia Panel: No results for input(s): VITAMINB12, FOLATE, FERRITIN, TIBC, IRON, RETICCTPCT in the last 72 hours. Urine analysis: No results found for: COLORURINE, APPEARANCEUR, LABSPEC, PHURINE, GLUCOSEU, HGBUR, BILIRUBINUR, KETONESUR, PROTEINUR, UROBILINOGEN, NITRITE, LEUKOCYTESUR Sepsis Labs: Invalid input(s): PROCALCITONIN, LACTICIDVEN  Recent Results (from the past 240 hour(s))  MRSA PCR Screening     Status: None   Collection Time: 07/02/16 11:57 PM  Result Value Ref Range Status   MRSA by PCR NEGATIVE NEGATIVE Final    Comment:        The GeneXpert MRSA Assay (FDA approved for NASAL specimens only), is one component of a comprehensive MRSA colonization surveillance program. It is not intended to diagnose MRSA infection nor to guide or monitor treatment for MRSA infections.       Radiology Studies: Dg Chest Port 1 View  07/02/2016  CLINICAL DATA:  45 year old male status post overdose, suicide attempt. Initial encounter. EXAM: PORTABLE CHEST 1 VIEW COMPARISON:  CT Abdomen and Pelvis 04/19/2005 FINDINGS: Portable AP semi upright view at 2026 hours. Normal cardiac size and mediastinal contours. Visualized tracheal air column is within normal limits. Low normal lung volumes. Allowing for portable technique, the lungs are clear. No pneumothorax or pleural effusion. IMPRESSION: No acute cardiopulmonary abnormality. Electronically Signed   By: Odessa FlemingH  Hall M.D.   On: 07/02/2016 20:47   Scheduled Meds: . antiseptic oral rinse  7 mL Mouth Rinse q12n4p  . chlorhexidine  15 mL Mouth Rinse BID  . enoxaparin (LOVENOX) injection  90 mg Subcutaneous Daily  . sodium chloride flush  3 mL Intravenous Q12H   Continuous Infusions:   Pamella Pertostin Drake Wuertz,  MD, PhD [facility] Pager [phone]  If 7PM-7AM, please contact night-coverage [email or web address] Password [email or web address] 07/03/2016, 10:49 AM

## 2016-07-03 NOTE — Consult Note (Signed)
[place] [place] Psychiatry Consult   Reason for Consult: intentional medication overdose, depressed Referring Physician:  Dr. Cruzita Lederer Patient Identification: Vincent Ruiz MRN:  [id number] Principal Diagnosis: Bipolar 2 disorder, major depressive episode [facility]) Diagnosis:   Patient Active Problem List   Diagnosis Date Noted  . Bipolar 2 disorder, major depressive episode ([place]) [F31.81] 07/03/2016    Priority: High  . Intentional benzodiazepine overdose ([place]) [T42.4X2A] 07/02/2016    Priority: High  . Hypertension [I10] 07/02/2016  . Morbid obesity ([place]) [E66.01] 07/02/2016  . Type 2 diabetes mellitus ([place]) [E11.9] 07/02/2016  . Hypothyroidism [E03.9] 07/02/2016  . Sleep apnea on CPAP [G47.30] 07/02/2016    Total Time spent with patient: 1 hour  Subjective:   Vincent Ruiz is a 45 y.o. male patient admitted with suicide attempt.  HPI:  Thanks for asking me to do a psychiatric consult on Vincent Ruiz, a 45 y.o. male with history of Bipolar depression, type 2 diabetes, hypertension, morbid obesity, anxiety, depression, hypothyroidism and obstructive sleep apnea on CPAP. Patient reports that he was brought to the emergency department via EMS after he intentionally attempted suicide by  ingesting 20-30 tablets of 81m of temazepam and 100 mg of Ultram around 1500. Patient left the text messages, that were read by EMS, in what was supposed to be written in his orbituary. Patient reports worsening depression since he lost his job, recently broke his foot, dealing with financial problem and life stressors. [facility] but denies prior inpatient admission. He denies delusional thinking, psychosis but unable to contract for safety.   Past Psychiatric History: as above  Risk to Self: Is patient at risk for suicide?: Yes Risk to Others:   Prior Inpatient Therapy:   Prior Outpatient Therapy:    Past Medical History:  Past Medical History  Diagnosis Date   . Diabetes mellitus without complication ([place]   . Hypertension   . Anxiety   . Depression   . Thyroid disease   . Sleep apnea     Past Surgical History  Procedure Laterality Date  . Knee surgery     Family History: History reviewed. No pertinent family history. Family Psychiatric  History: Social History:  History  Alcohol Use No     History  Drug Use No    Social History   Social History  . Marital Status: Married    Spouse Name: N/A  . Number of Children: N/A  . Years of Education: N/A   Social History Main Topics  . Smoking status: Never Smoker   . Smokeless tobacco: None  . Alcohol Use: No  . Drug Use: No  . Sexual Activity: Not Asked   Other Topics Concern  . None   Social History Narrative  . None   Additional Social History:    Allergies:   Allergies  Allergen Reactions  . Penicillins Other (See Comments)    Has patient had a PCN reaction causing immediate rash, facial/tongue/throat swelling, SOB or lightheadedness with hypotension: Yes Has patient had a PCN reaction causing severe rash involving mucus membranes or skin necrosis: Yes Has patient had a PCN reaction that required hospitalization Yes Has patient had a PCN reaction occurring within the last 10 years: No If all of the above answers are "NO", then may proceed with Cephalosporin use.     Labs:  Results for orders placed or performed during the hospital encounter of 07/02/16 (from the past 48 hour(s))  Comprehensive metabolic panel  Status: Abnormal   Collection Time: 07/02/16  8:41 PM  Result Value Ref Range   Sodium 135 135 - 145 mmol/L   Potassium 4.3 3.5 - 5.1 mmol/L   Chloride 102 101 - 111 mmol/L   CO2 26 22 - 32 mmol/L   Glucose, Bld 148 (H) 65 - 99 mg/dL   BUN 11 6 - 20 mg/dL   Creatinine, Ser 0.76 0.61 - 1.24 mg/dL   Calcium 8.9 8.9 - 10.3 mg/dL   Total Protein 7.6 6.5 - 8.1 g/dL   Albumin 3.9 3.5 - 5.0 g/dL   AST 29 15 - 41 U/L   ALT 27 17 - 63 U/L   Alkaline  Phosphatase 54 38 - 126 U/L   Total Bilirubin 1.2 0.3 - 1.2 mg/dL   GFR calc non Af Amer >60 >60 mL/min   GFR calc Af Amer >60 >60 mL/min    Comment: (NOTE) The eGFR has been calculated using the CKD EPI equation. This calculation has not been validated in all clinical situations. eGFR's persistently <60 mL/min signify possible Chronic Kidney Disease.    Anion gap 7 5 - 15  Ethanol     Status: None   Collection Time: 07/02/16  8:41 PM  Result Value Ref Range   Alcohol, Ethyl (B) <5 <5 mg/dL    Comment:        LOWEST DETECTABLE LIMIT FOR SERUM ALCOHOL IS 5 mg/dL FOR MEDICAL PURPOSES ONLY   Salicylate level     Status: None   Collection Time: 07/02/16  8:41 PM  Result Value Ref Range   Salicylate Lvl <5.0 2.8 - 30.0 mg/dL  Acetaminophen level     Status: Abnormal   Collection Time: 07/02/16  8:41 PM  Result Value Ref Range   Acetaminophen (Tylenol), Serum <10 (L) 10 - 30 ug/mL    Comment:        THERAPEUTIC CONCENTRATIONS VARY SIGNIFICANTLY. A RANGE OF 10-30 ug/mL MAY BE AN EFFECTIVE CONCENTRATION FOR MANY PATIENTS. HOWEVER, SOME ARE BEST TREATED AT CONCENTRATIONS OUTSIDE THIS RANGE. ACETAMINOPHEN CONCENTRATIONS >150 ug/mL AT 4 HOURS AFTER INGESTION AND >50 ug/mL AT 12 HOURS AFTER INGESTION ARE OFTEN ASSOCIATED WITH TOXIC REACTIONS.   cbc     Status: None   Collection Time: 07/02/16  8:41 PM  Result Value Ref Range   WBC 9.4 4.0 - 10.5 K/uL   RBC 5.09 4.22 - 5.81 MIL/uL   Hemoglobin 15.7 13.0 - 17.0 g/dL   HCT 46.0 39.0 - 52.0 %   MCV 90.4 78.0 - 100.0 fL   MCH 30.8 26.0 - 34.0 pg   MCHC 34.1 30.0 - 36.0 g/dL   RDW 13.1 11.5 - 15.5 %   Platelets 225 150 - 400 K/uL  Blood gas, arterial     Status: Abnormal   Collection Time: 07/02/16  9:00 PM  Result Value Ref Range   O2 Content 12.0 L/min   Delivery systems CONTINUOUS POSITIVE AIRWAY PRESSURE    pH, Arterial 7.355 7.350 - 7.450   pCO2 arterial 48.3 (H) 35.0 - 45.0 mmHg   pO2, Arterial 112 (H) 80.0 - 100.0  mmHg   Bicarbonate 26.3 (H) 20.0 - 24.0 mEq/L   TCO2 22.9 0 - 100 mmol/L   Acid-Base Excess 0.5 0.0 - 2.0 mmol/L   O2 Saturation 97.6 %   Patient temperature 98.6    Collection site RIGHT RADIAL    Drawn by [id number]    Sample type ARTERIAL DRAW    Allens test (pass/fail) PASS  PASS  MRSA PCR Screening     Status: None   Collection Time: 07/02/16 11:57 PM  Result Value Ref Range   MRSA by PCR NEGATIVE NEGATIVE    Comment:        The GeneXpert MRSA Assay (FDA approved for NASAL specimens only), is one component of a comprehensive MRSA colonization surveillance program. It is not intended to diagnose MRSA infection nor to guide or monitor treatment for MRSA infections.   Rapid urine drug screen (hospital performed)     Status: Abnormal   Collection Time: 07/03/16  1:51 AM  Result Value Ref Range   Opiates NONE DETECTED NONE DETECTED   Cocaine NONE DETECTED NONE DETECTED   Benzodiazepines POSITIVE (A) NONE DETECTED   Amphetamines NONE DETECTED NONE DETECTED   Tetrahydrocannabinol NONE DETECTED NONE DETECTED   Barbiturates NONE DETECTED NONE DETECTED    Comment:        DRUG SCREEN FOR MEDICAL PURPOSES ONLY.  IF CONFIRMATION IS NEEDED FOR ANY PURPOSE, NOTIFY LAB WITHIN 5 DAYS.        LOWEST DETECTABLE LIMITS FOR URINE DRUG SCREEN Drug Class       Cutoff (ng/mL) Amphetamine      1000 Barbiturate      200 Benzodiazepine   262 Tricyclics       035 Opiates          300 Cocaine          300 THC              50   CBC WITH DIFFERENTIAL     Status: None   Collection Time: 07/03/16  3:07 AM  Result Value Ref Range   WBC 7.8 4.0 - 10.5 K/uL   RBC 4.75 4.22 - 5.81 MIL/uL   Hemoglobin 14.9 13.0 - 17.0 g/dL   HCT 43.3 39.0 - 52.0 %   MCV 91.2 78.0 - 100.0 fL   MCH 31.4 26.0 - 34.0 pg   MCHC 34.4 30.0 - 36.0 g/dL   RDW 13.3 11.5 - 15.5 %   Platelets 220 150 - 400 K/uL   Neutrophils Relative % 67 %   Neutro Abs 5.3 1.7 - 7.7 K/uL   Lymphocytes Relative 21 %   Lymphs Abs 1.6  0.7 - 4.0 K/uL   Monocytes Relative 10 %   Monocytes Absolute 0.8 0.1 - 1.0 K/uL   Eosinophils Relative 2 %   Eosinophils Absolute 0.1 0.0 - 0.7 K/uL   Basophils Relative 0 %   Basophils Absolute 0.0 0.0 - 0.1 K/uL  Comprehensive metabolic panel     Status: Abnormal   Collection Time: 07/03/16  3:07 AM  Result Value Ref Range   Sodium 137 135 - 145 mmol/L   Potassium 4.4 3.5 - 5.1 mmol/L   Chloride 105 101 - 111 mmol/L   CO2 27 22 - 32 mmol/L   Glucose, Bld 172 (H) 65 - 99 mg/dL   BUN 11 6 - 20 mg/dL   Creatinine, Ser 0.84 0.61 - 1.24 mg/dL   Calcium 8.6 (L) 8.9 - 10.3 mg/dL   Total Protein 7.1 6.5 - 8.1 g/dL   Albumin 3.7 3.5 - 5.0 g/dL   AST 24 15 - 41 U/L   ALT 25 17 - 63 U/L   Alkaline Phosphatase 52 38 - 126 U/L   Total Bilirubin 1.3 (H) 0.3 - 1.2 mg/dL   GFR calc non Af Amer >60 >60 mL/min   GFR calc Af Amer >60 >60 mL/min  Comment: (NOTE) The eGFR has been calculated using the CKD EPI equation. This calculation has not been validated in all clinical situations. eGFR's persistently <60 mL/min signify possible Chronic Kidney Disease.    Anion gap 5 5 - 15  TSH     Status: None   Collection Time: 07/03/16  3:07 AM  Result Value Ref Range   TSH 1.463 0.350 - 4.500 uIU/mL    Current Facility-Administered Medications  Medication Dose Route Frequency Provider Last Rate Last Dose  . antiseptic oral rinse (CPC / CETYLPYRIDINIUM CHLORIDE 0.05%) solution 7 mL  7 mL Mouth Rinse q12n4p Reubin Milan, MD      . chlorhexidine (PERIDEX) 0.12 % solution 15 mL  15 mL Mouth Rinse BID Reubin Milan, MD   15 mL at 07/03/16 1013  . enoxaparin (LOVENOX) injection 90 mg  90 mg Subcutaneous Daily Dorrene German, RPH   90 mg at 07/03/16 1015  . escitalopram (LEXAPRO) tablet 20 mg  20 mg Oral Daily Tyreque Finken, MD      . lamoTRIgine (LAMICTAL) tablet 100 mg  100 mg Oral Daily Erina Hamme, MD      . sodium chloride flush (NS) 0.9 % injection 3 mL  3 mL Intravenous Q12H  Reubin Milan, MD   3 mL at 07/03/16 1013  . traZODone (DESYREL) tablet 100 mg  100 mg Oral QHS PRN Corena Pilgrim, MD        Musculoskeletal: Strength & Muscle Tone: within normal limits Gait & Station: normal Patient leans: N/A  Psychiatric Specialty Exam: Physical Exam  Psychiatric: His speech is normal. He is slowed and withdrawn. Cognition and memory are normal. He expresses impulsivity. He exhibits a depressed mood. He expresses suicidal ideation. He expresses suicidal plans.    Review of Systems  Constitutional: Negative.   HENT: Negative.   Eyes: Negative.   Respiratory: Negative.   Cardiovascular: Negative.   Gastrointestinal: Negative.   Genitourinary: Negative.   Musculoskeletal: Negative.   Skin: Negative.   Neurological: Negative.   Endo/Heme/Allergies: Negative.   Psychiatric/Behavioral: Positive for depression and suicidal ideas. The patient has insomnia.     Blood pressure 123/73, pulse 68, temperature 97.3 F (36.3 C), temperature source Axillary, resp. rate 22, height '6\' 2"'  (1.88 m), weight 185.6 kg (409 lb 2.8 oz), SpO2 100 %.Body mass index is 52.51 kg/(m^2).  General Appearance: Casual  Eye Contact:  Minimal  Speech:  Clear and Coherent  Volume:  Decreased  Mood:  Anxious, Depressed, Dysphoric and Hopeless  Affect:  Constricted  Thought Process:  Coherent and Descriptions of Associations: Intact  Orientation:  Full (Time, Place, and Person)  Thought Content:  Logical  Suicidal Thoughts:  Yes.  with intent/plan  Homicidal Thoughts:  No  Memory:  Immediate;   Good Recent;   Good Remote;   Good  Judgement:  Impaired  Insight:  Lacking  Psychomotor Activity:  Psychomotor Retardation  Concentration:  Concentration: Fair and Attention Span: Good  Recall:  Good  Fund of Knowledge:  Good  Language:  Good  Akathisia:  No  Handed:  Right  AIMS (if indicated):     Assets:  Communication Skills Desire for Improvement  ADL's:  Intact  Cognition:   WNL  Sleep:   poor     Treatment Plan Summary: -Crisis statbilization -Daily contact with patient to assess and evaluate symptoms and progress in treatment, Medication management. -Continue Lexapro 36m daily for depression. -Continue Lamictal 1043mdaily for Bipolar disorder  Disposition:  Recommend psychiatric Inpatient admission when medically cleared. Supportive therapy provided about ongoing stressors. Unit social worker to assist in psychiatric inpatient admission for stabilization  Corena Pilgrim, MD 07/03/2016 12:08 PM

## 2016-07-03 NOTE — Progress Notes (Signed)
CSW received consult for inpatient psych placement when medically stable.  Per MD note from today likely stable for psych placement tomorrow  Will need to be documented clearly that pt is medically stable for DC then weekday social worker can begin referral process  Merlyn LotJenna Holoman, Brentwood HospitalCSWA Clinical Social Worker [phone]

## 2016-07-04 DIAGNOSIS — F3181 Bipolar II disorder: Secondary | ICD-10-CM | POA: Diagnosis not present

## 2016-07-04 MED ORDER — LISINOPRIL 20 MG PO TABS
20.0000 mg | ORAL_TABLET | Freq: Every day | ORAL | Status: DC
  Administered 2016-07-04: 20 mg via ORAL
  Filled 2016-07-04: qty 2

## 2016-07-04 MED ORDER — HYDROCHLOROTHIAZIDE 12.5 MG PO CAPS
12.5000 mg | ORAL_CAPSULE | Freq: Every day | ORAL | Status: DC
  Administered 2016-07-04: 12.5 mg via ORAL
  Filled 2016-07-04: qty 1

## 2016-07-04 MED ORDER — LIP MEDEX EX OINT
TOPICAL_OINTMENT | CUTANEOUS | Status: AC
  Administered 2016-07-04: 15:00:00
  Filled 2016-07-04: qty 7

## 2016-07-04 MED ORDER — LISINOPRIL-HYDROCHLOROTHIAZIDE 10-12.5 MG PO TABS: 2.0000 | ORAL_TABLET | Freq: Every day | ORAL | Status: DC

## 2016-07-04 NOTE — Significant Event (Deleted)
Rapid Response Event Note  Overview: Time Called: 2118 Arrival Time: 2125 Event Type: Other (Comment) (sepsis)  Initial Focused Assessment:Patient admitted to 1237, bedside RN called regarding labs and sepsis picture. Pt. C/O abdominal pain and states she has chronic bowel obstructions [facility] Temp was 102.5 had been over 103 previously. Bp dropped to 89/55 and 77/45. Lactic acid 3.6.   Interventions:Monitored, NS bolus, CXR, decubitus abd. Xray. Transferred to [place].  Plan of Care (if not transferred):  Event Summary: Name of Physician Notified: Dr. Julian ReilGardner at 2120    at    Outcome: Transferred (Comment) [phone])  Event End Time: 2200  Bayard HuggerDenny, Tyr Franca B

## 2016-07-04 NOTE — Discharge Summary (Signed)
Physician Discharge Summary  Vincent Ruiz ZOX:[id number] DOB: [birth year] DOA: 07/02/2016  PCP: No primary care provider on file.  Admit date: 07/02/2016 Discharge date: 07/04/2016  Admitted From: home Disposition:  Behavioral health  Recommendations for Outpatient Follow-up:  1. Follow up with PCP for BP monitoring after [facility] discharge  Home Health: none Equipment/Devices: none  Discharge Condition: stable CODE STATUS: Full Diet recommendation: heart healthy  HPI: 45 y.o. male with medical history significant of type 2 diabetes, hypertension, morbid obesity, anxiety, depression, hypothyroidism, obstructive sleep apnea on CPAP who was brought to the emergency department via EMS after ingesting 300 mg of temazepam around 1800 with suicidal purposes. It is also reported that the patient took 100 mg of Ultram around 1500. Patient left the text messages, that were read by EMS, in what was supposed to be written in his orbituary.   Hospital Course: Discharge Diagnoses:  Principal Problem:   Bipolar 2 disorder, major depressive episode (HCC) Active Problems:   Intentional benzodiazepine overdose (HCC)   Hypertension   Type 2 diabetes mellitus (HCC)   Hypothyroidism   Sleep apnea on CPAP  Intentional benzodiazepine overdose (HCC) - stable, alert, mental status back to baseline, no complaints. Admits to SI, denies attempting this in the past however has had suicidal ideation in the past. Psychiatry consulted and recommended inpatient psych admission. Patient is medically stable for discharge. Hypertension - not optimally controlled, suspect chronically elevated at home as well. Will increase home Lisinopril/HCTZ. Can be further up-titrated if needed at [facility]. Will need outpatient follow up with PCP once home. Type 2 diabetes mellitus (HCC) - continue home medications.  Hypothyroidism - Resume levothyroxine Sleep apnea on CPAP - Continue CPAP ventilation QHS   Discharge  Instructions     Medication List    TAKE these medications        escitalopram 20 MG tablet  Commonly known as:  LEXAPRO  Take 20 mg by mouth daily.     lamoTRIgine 100 MG tablet  Commonly known as:  LAMICTAL  Take 100 mg by mouth daily.     levothyroxine 100 MCG tablet  Commonly known as:  SYNTHROID, LEVOTHROID  Take 100 mcg by mouth daily before breakfast.     lisinopril-hydrochlorothiazide 10-12.5 MG tablet  Commonly known as:  PRINZIDE,ZESTORETIC  Take 2 tablets by mouth daily.     metFORMIN 500 MG tablet  Commonly known as:  GLUCOPHAGE  Take 500 mg by mouth 2 (two) times daily with a meal.     temazepam 15 MG capsule  Commonly known as:  RESTORIL  Take 15 mg by mouth at bedtime as needed for sleep.     testosterone cypionate 200 MG/ML injection  Commonly known as:  DEPOTESTOSTERONE CYPIONATE  Inject 100 mg into the muscle once a week.     traMADol 50 MG tablet  Commonly known as:  ULTRAM  Take 50 mg by mouth every 6 (six) hours as needed for moderate pain or severe pain.     VITAMIN D (CHOLECALCIFEROL) PO  Take 1 capsule by mouth daily.        Allergies  Allergen Reactions  . Penicillins Other (See Comments)    Has patient had a PCN reaction causing immediate rash, facial/tongue/throat swelling, SOB or lightheadedness with hypotension: Yes Has patient had a PCN reaction causing severe rash involving mucus membranes or skin necrosis: Yes Has patient had a PCN reaction that required hospitalization Yes Has patient had a PCN reaction occurring within the last  10 years: No If all of the above answers are "NO", then may proceed with Cephalosporin use.     Consultations:  Psychiatry   Procedures/Studies:  Dg Chest Port 1 View  07/02/2016  CLINICAL DATA:  45 year old male status post overdose, suicide attempt. Initial encounter. EXAM: PORTABLE CHEST 1 VIEW COMPARISON:  CT Abdomen and Pelvis 04/19/2005 FINDINGS: Portable AP semi upright view at 2026 hours.  Normal cardiac size and mediastinal contours. Visualized tracheal air column is within normal limits. Low normal lung volumes. Allowing for portable technique, the lungs are clear. No pneumothorax or pleural effusion. IMPRESSION: No acute cardiopulmonary abnormality. Electronically Signed   By: Odessa Fleming M.D.   On: 07/02/2016 20:47      Subjective: - no complaints, alert, ambulating in the room without chest pain, dyspnea, palpitations.   Discharge Exam: Filed Vitals:   07/04/16 0755 07/04/16 0813  BP: 155/74   Pulse: 78   Temp:  98.1 F (36.7 C)  Resp: 17    Filed Vitals:   07/04/16 0600 07/04/16 0700 07/04/16 0755 07/04/16 0813  BP: 167/58 168/98 155/74   Pulse: 64 74 78   Temp:    98.1 F (36.7 C)  TempSrc:    Oral  Resp: Height:      Weight:      SpO2: 100% 97% 95%     General: Pt is alert, awake, not in acute distress Cardiovascular: RRR, S1/S2 +, no rubs, no gallops Respiratory: CTA bilaterally, no wheezing, no rhonchi Abdominal: Soft, NT, ND, bowel sounds + Extremities: no edema, no cyanosis    The results of significant diagnostics from this hospitalization (including imaging, microbiology, ancillary and laboratory) are listed below for reference.     Microbiology: Recent Results (from the past 240 hour(s))  MRSA PCR Screening     Status: None   Collection Time: 07/02/16 11:57 PM  Result Value Ref Range Status   MRSA by PCR NEGATIVE NEGATIVE Final    Comment:        The GeneXpert MRSA Assay (FDA approved for NASAL specimens only), is one component of a comprehensive MRSA colonization surveillance program. It is not intended to diagnose MRSA infection nor to guide or monitor treatment for MRSA infections.      Labs: BNP (last 3 results) No results for input(s): BNP in the last 8760 hours. Basic Metabolic Panel:  Recent Labs Lab 07/02/16 2041 07/03/16 0307  NA 135 137  K 4.3 4.4  CL 102 105  CO2 26 27  GLUCOSE 148* 172*  BUN 11  11  CREATININE 0.76 0.84  CALCIUM 8.9 8.6*   Liver Function Tests:  Recent Labs Lab 07/02/16 2041 07/03/16 0307  AST 29 24  ALT 27 25  ALKPHOS 54 52  BILITOT 1.2 1.3*  PROT 7.6 7.1  ALBUMIN 3.9 3.7   CBC:  Recent Labs Lab 07/02/16 2041 07/03/16 0307  WBC 9.4 7.8  NEUTROABS  --  5.3  HGB 15.7 14.9  HCT 46.0 43.3  MCV 90.4 91.2  PLT 225 220   Thyroid function studies  Recent Labs  07/03/16 0307  TSH 1.463   Microbiology Recent Results (from the past 240 hour(s))  MRSA PCR Screening     Status: None   Collection Time: 07/02/16 11:57 PM  Result Value Ref Range Status   MRSA by PCR NEGATIVE NEGATIVE Final    Comment:        The GeneXpert MRSA Assay (FDA approved for NASAL specimens  only), is one component of a comprehensive MRSA colonization surveillance program. It is not intended to diagnose MRSA infection nor to guide or monitor treatment for MRSA infections.    Time coordinating discharge: Over 30 minutes  SIGNED:  Pamella PertGHERGHE, COSTIN, MD  [facility] 07/04/2016, 9:36 AM Pager [phone]  If 7PM-7AM, please contact night-coverage [email or web address] Password [email or web address]

## 2016-07-04 NOTE — Progress Notes (Addendum)
[facility]  [facility]: No beds. AC will inform once bed available. [facility]:No beds. AC will inform once bed available. [facility]: No beds. AC will inform once bed available. [facility]:No beds. AC will inform once bed available. [facility]:No beds. AC will inform once bed available. [facility]: Pending. [facility].  [facility], Waiting for bed availability at this time.  Vivi BarrackNicole Jamina Ruiz, Vincent MajorsLCSWA, MSW Clinical Social Worker 5E and Psychiatric Service Line [phone] 07/04/2016  10:26 AM

## 2016-07-04 NOTE — Progress Notes (Signed)
[facility].

## 2016-07-04 NOTE — Progress Notes (Addendum)
Patient unable to complete full assessment due to family in room, that patient reports is a great support. Patient did share [facility] Patient reports he been using there services awhile for medication and therapy. Patient reported this is his first suicide attempt, although he has often thought about it. Patient reports, "I do not know what made this time different." Patient reports having Bipolar 2 disorder. Patient reports taking Lexapro and Lamictal. Patient did not really want to go into detail but also did not want his family to leave.   Sheriff called for transport. Patient informed, and family. IVC Paperwork given. Report for [facility] given to RN.   Vivi BarrackNicole Lun Muro, Theresia MajorsLCSWA, MSW Clinical Social Worker 5E and Psychiatric Service Line [phone] 07/04/2016  5:56 PM

## 2016-11-30 IMAGING — DX DG CHEST 1V PORT
1 series · 1 of 1 positions shown · non-contrast
Comparison: CT Abdomen and Pelvis 04/19/2005

CLINICAL DATA: 44-year-old male status post overdose, suicide
attempt. Initial encounter.

EXAM:
PORTABLE CHEST 1 VIEW

[chest ap]
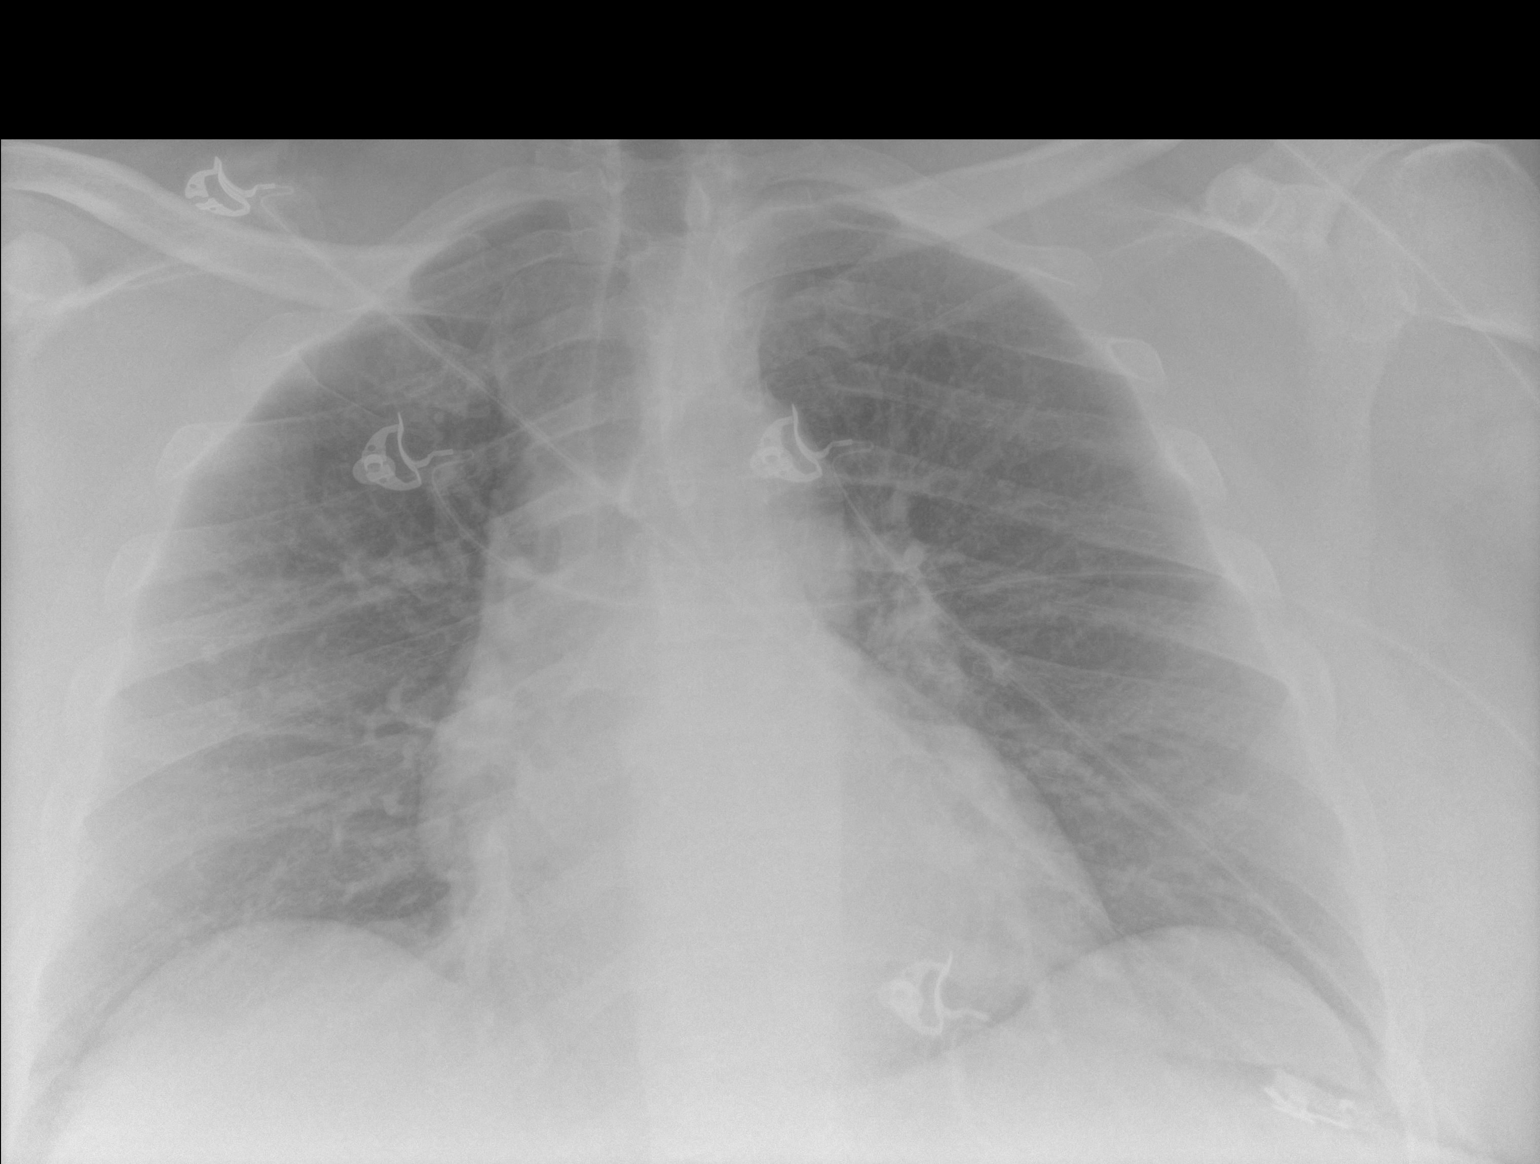

[1 of 1 positions shown; findings below may reference images not displayed]

FINDINGS: Portable AP semi upright view at 8787 hours. Normal cardiac size and
mediastinal contours. Visualized tracheal air column is within
normal limits. Low normal lung volumes. Allowing for portable
technique, the lungs are clear. No pneumothorax or pleural effusion.
IMPRESSION: No acute cardiopulmonary abnormality.

## 2017-02-16 ENCOUNTER — Encounter (HOSPITAL_COMMUNITY): Payer: Self-pay

## 2017-02-16 ENCOUNTER — Emergency Department (HOSPITAL_COMMUNITY)
Admission: EM | Admit: 2017-02-16 | Discharge: 2017-02-16 | Disposition: A | Discharge status: Home / Self Care | Payer: 59 | Attending: Emergency Medicine | Admitting: Emergency Medicine

## 2017-02-16 DIAGNOSIS — Z79899 Other long term (current) drug therapy: Secondary | ICD-10-CM | POA: Insufficient documentation

## 2017-02-16 DIAGNOSIS — M62830 Muscle spasm of back: Secondary | ICD-10-CM | POA: Insufficient documentation

## 2017-02-16 DIAGNOSIS — E039 Hypothyroidism, unspecified: Secondary | ICD-10-CM | POA: Insufficient documentation

## 2017-02-16 DIAGNOSIS — Z7984 Long term (current) use of oral hypoglycemic drugs: Secondary | ICD-10-CM | POA: Insufficient documentation

## 2017-02-16 DIAGNOSIS — I10 Essential (primary) hypertension: Secondary | ICD-10-CM | POA: Insufficient documentation

## 2017-02-16 DIAGNOSIS — M545 Low back pain: Secondary | ICD-10-CM | POA: Diagnosis present

## 2017-02-16 DIAGNOSIS — E119 Type 2 diabetes mellitus without complications: Secondary | ICD-10-CM | POA: Insufficient documentation

## 2017-02-16 LAB — URINALYSIS, ROUTINE W REFLEX MICROSCOPIC
Bacteria, UA: NONE SEEN
Bilirubin Urine: NEGATIVE
Hgb urine dipstick: NEGATIVE
KETONES UR: NEGATIVE mg/dL
Leukocytes, UA: NEGATIVE
NITRITE: NEGATIVE
PH: 5 (ref 5.0–8.0)
Protein, ur: 30 mg/dL — AB
Specific Gravity, Urine: 1.022 (ref 1.005–1.030)

## 2017-02-16 LAB — BASIC METABOLIC PANEL
Anion gap: 7 (ref 5–15)
BUN: 14 mg/dL (ref 6–20)
CHLORIDE: 101 mmol/L (ref 101–111)
CO2: 28 mmol/L (ref 22–32)
Calcium: 9.5 mg/dL (ref 8.9–10.3)
Creatinine, Ser: 0.95 mg/dL (ref 0.61–1.24)
GFR calc non Af Amer: >60 mL/min (ref >60)
GLUCOSE: 181 mg/dL — AB (ref 65–99)
Potassium: 4.8 mmol/L (ref 3.5–5.1)
Sodium: 136 mmol/L (ref 135–145)

## 2017-02-16 LAB — CBC WITH DIFFERENTIAL/PLATELET
BASOS ABS: 0 10*3/uL (ref 0.0–0.1)
BASOS PCT: 0 %
Eosinophils Absolute: 0.2 10*3/uL (ref 0.0–0.7)
Eosinophils Relative: 2 %
HEMATOCRIT: 43.9 % (ref 39.0–52.0)
Hemoglobin: 15.2 g/dL (ref 13.0–17.0)
LYMPHS PCT: 15 %
Lymphs Abs: 1.5 10*3/uL (ref 0.7–4.0)
MCH: 31.5 pg (ref 26.0–34.0)
MCHC: 34.6 g/dL (ref 30.0–36.0)
MCV: 90.9 fL (ref 78.0–100.0)
MONOS PCT: 9 %
Monocytes Absolute: 0.9 10*3/uL (ref 0.1–1.0)
NEUTROS ABS: 7.4 10*3/uL (ref 1.7–7.7)
Neutrophils Relative %: 74 %
Platelets: 250 10*3/uL (ref 150–400)
RBC: 4.83 MIL/uL (ref 4.22–5.81)
RDW: 12.3 % (ref 11.5–15.5)
WBC: 10 10*3/uL (ref 4.0–10.5)

## 2017-02-16 MED ORDER — IBUPROFEN 800 MG PO TABS
800.0000 mg | ORAL_TABLET | Freq: Once | ORAL | Status: AC
  Administered 2017-02-16: 800 mg via ORAL
  Filled 2017-02-16: qty 1

## 2017-02-16 MED ORDER — METHOCARBAMOL 500 MG PO TABS
750.0000 mg | ORAL_TABLET | Freq: Once | ORAL | Status: AC
  Administered 2017-02-16: 750 mg via ORAL
  Filled 2017-02-16: qty 2

## 2017-02-16 MED ORDER — NAPROXEN 500 MG PO TABS: 500.0000 mg | ORAL_TABLET | Freq: Two times a day (BID) | ORAL | 0 refills | Status: DC

## 2017-02-16 MED ORDER — METHOCARBAMOL 500 MG PO TABS: 500.0000 mg | ORAL_TABLET | Freq: Two times a day (BID) | ORAL | 0 refills | Status: DC

## 2017-02-16 MED ORDER — ACETAMINOPHEN 325 MG PO TABS
650.0000 mg | ORAL_TABLET | Freq: Once | ORAL | Status: AC
  Administered 2017-02-16: 650 mg via ORAL
  Filled 2017-02-16: qty 2

## 2017-02-16 NOTE — ED Notes (Signed)
Pt requested help to stand up to pee. He was unable to get up. States that he will attempt to urinate sitting down.

## 2017-02-16 NOTE — ED Provider Notes (Signed)
[facility]-EMERGENCY DEPT Provider Note   CSN: [id number] Arrival date & time: 02/16/17  1504   By signing my name below, I, Freida Busmaniana Omoyeni, attest that this documentation has been prepared under the direction and in the presence of Sharen Hecklaudia Antonette Hendricks, PA-C. Electronically Signed: Freida Busmaniana Omoyeni, Scribe. 02/16/2017. 4:44 PM.  History   Chief Complaint Chief Complaint  Patient presents with  . Back Pain    The history is provided by the patient. No language interpreter was used.    HPI Comments:  Vincent Ruiz is a 46 y.o. morbidly obese male who presents to the Emergency Department complaining of 10/10 left sided lower back pain which began yesterday evening. Pt was resting when he first noticed the pain but reports vacuuming and housekeeping earlier in the day. No other known injury or falls. His pain radiates around to his left groin. Pt has a h/o kidney stones and notes pain today is similar. He has taken Aleve with minimal relief; last dose was last night. His pain is worse with movement. He denies fevers, nausea, vomiting, constipation, diarrhea, abdominal pain, dysuria and hematuria.  No nausea or vomiting. Also denies IVDA.   Past Medical History:  Diagnosis Date  . Anxiety   . Depression   . Diabetes mellitus without complication (HCC)   . Hypertension   . Sleep apnea   . Thyroid disease     Patient Active Problem List   Diagnosis Date Noted  . Bipolar 2 disorder, major depressive episode (HCC) 07/03/2016  . Intentional benzodiazepine overdose (HCC) 07/02/2016  . Hypertension 07/02/2016  . Morbid obesity (HCC) 07/02/2016  . Type 2 diabetes mellitus (HCC) 07/02/2016  . Hypothyroidism 07/02/2016  . Sleep apnea on CPAP 07/02/2016    Past Surgical History:  Procedure Laterality Date  . KNEE SURGERY         Home Medications    Prior to Admission medications   Medication Sig Start Date End Date Taking? Authorizing Provider  escitalopram (LEXAPRO) 20 MG tablet Take 20  mg by mouth daily.    Historical Provider, MD  lamoTRIgine (LAMICTAL) 100 MG tablet Take 100 mg by mouth daily.    Historical Provider, MD  levothyroxine (SYNTHROID, LEVOTHROID) 100 MCG tablet Take 100 mcg by mouth daily before breakfast.    Historical Provider, MD  lisinopril-hydrochlorothiazide (PRINZIDE,ZESTORETIC) 10-12.5 MG tablet Take 2 tablets by mouth daily. 07/04/16   Costin Otelia SergeantM Gherghe, MD  metFORMIN (GLUCOPHAGE) 500 MG tablet Take 500 mg by mouth 2 (two) times daily with a meal.    Historical Provider, MD  methocarbamol (ROBAXIN) 500 MG tablet Take 1 tablet (500 mg total) by mouth 2 (two) times daily. 02/16/17   Liberty Handylaudia J Annita Ratliff, PA-C  naproxen (NAPROSYN) 500 MG tablet Take 1 tablet (500 mg total) by mouth 2 (two) times daily. 02/16/17   Liberty Handylaudia J Stevin Bielinski, PA-C  temazepam (RESTORIL) 15 MG capsule Take 15 mg by mouth at bedtime as needed for sleep.    Historical Provider, MD  testosterone cypionate (DEPOTESTOSTERONE CYPIONATE) 200 MG/ML injection Inject 100 mg into the muscle once a week.    Historical Provider, MD  traMADol (ULTRAM) 50 MG tablet Take 50 mg by mouth every 6 (six) hours as needed for moderate pain or severe pain.    Historical Provider, MD  VITAMIN D, CHOLECALCIFEROL, PO Take 1 capsule by mouth daily.    Historical Provider, MD    Family History History reviewed. No pertinent family history.  Social History Social History  Substance Use Topics  .  Smoking status: Never Smoker  . Smokeless tobacco: Never Used  . Alcohol use No     Allergies   Penicillins   Review of Systems Review of Systems  Constitutional: Negative for chills and fever.  Eyes: Negative for visual disturbance.  Respiratory: Negative for cough, chest tightness and shortness of breath.   Cardiovascular: Negative for chest pain.  Gastrointestinal: Negative for abdominal pain, constipation, diarrhea, nausea and vomiting.  Genitourinary: Positive for flank pain. Negative for decreased urine volume,  dysuria, frequency and hematuria.  Musculoskeletal: Positive for back pain and gait problem. Negative for arthralgias and joint swelling.  Neurological: Negative for dizziness.    Physical Exam Updated Vital Signs BP 142/77 (BP Location: Right Arm)   Pulse 87   Temp 98.2 F (36.8 C) (Oral)   Resp 16   SpO2 98%   Physical Exam  Constitutional: He is oriented to person, place, and time. He appears well-developed and well-nourished. No distress.  HENT:  Head: Normocephalic and atraumatic.  Right Ear: External ear normal.  Left Ear: External ear normal.  Nose: Nose normal.  Mouth/Throat: Oropharynx is clear and moist. No oropharyngeal exudate.  Eyes: Conjunctivae and EOM are normal. Pupils are equal, round, and reactive to light. No scleral icterus.  Neck: Normal range of motion. Neck supple. No JVD present. No tracheal deviation present.  Cardiovascular: Normal rate, regular rhythm, normal heart sounds and intact distal pulses.   No murmur heard. Pulmonary/Chest: Effort normal and breath sounds normal. He has no wheezes.  Abdominal: Soft. He exhibits no distension and no mass. There is no tenderness. There is no guarding.  No suprapubic tenderness. No CVAT.   Musculoskeletal: Normal range of motion. He exhibits no deformity.  Left lumbar muscular tenderness Left sciatic notch tenderness  No midline CTL spine tenderness.  SI joints non tender.  Pt had difficult time going from sitting to standing Was unable to tolerate a more thorough exam    Lymphadenopathy:    He has no cervical adenopathy.  Neurological: He is alert and oriented to person, place, and time.  5/5 strength with hip flexion and extension, bilaterally.  5/5 strength with knee flexion and extension, bilaterally.  5/5 strength with ankle dorsiflexion and plantar flexion, bilaterally.  Sensation to light touch intact in the distribution of the obturator nerve, lateral cutaneous nerve, femoral nerve, common fibular  nerve.  2/4 knee and ankle DTR bilaterally.    Foot: sensation to light touch intact in the distribution of the saphenous nerve, medial plantar nerve, lateral plantar nerve, bilaterally.   Skin: Skin is warm and dry. Capillary refill takes less than 2 seconds.  Psychiatric: He has a normal mood and affect. His behavior is normal. Judgment and thought content normal.  Nursing note and vitals reviewed.    ED Treatments / Results  DIAGNOSTIC STUDIES:  Oxygen Saturation is 100% on RA, normal by my interpretation.    COORDINATION OF CARE:  4:41 PM Discussed treatment plan with pt at bedside and pt agreed to plan.  Labs (all labs ordered are listed, but only abnormal results are displayed) Labs Reviewed  URINALYSIS, ROUTINE W REFLEX MICROSCOPIC - Abnormal; Notable for the following:       Result Value   Glucose, UA >=500 (*)    Protein, ur 30 (*)    Squamous Epithelial / LPF 0-5 (*)    All other components within normal limits  BASIC METABOLIC PANEL - Abnormal; Notable for the following:    Glucose, Bld 181 (*)  All other components within normal limits  CBC WITH DIFFERENTIAL/PLATELET    EKG  EKG Interpretation None       Radiology No results found.  Procedures Procedures (including critical care time)  Medications Ordered in ED Medications  ibuprofen (ADVIL,MOTRIN) tablet 800 mg (800 mg Oral Given 02/16/17 1830)  acetaminophen (TYLENOL) tablet 650 mg (650 mg Oral Given 02/16/17 1829)  methocarbamol (ROBAXIN) tablet 750 mg (750 mg Oral Given 02/16/17 1830)     Initial Impression / Assessment and Plan / ED Course  I have reviewed the triage vital signs and the nursing notes.  Pertinent labs & imaging results that were available during my care of the patient were reviewed by me and considered in my medical decision making (see chart for details).  Clinical Course as of Feb 20 2327  Wynelle Link Feb 19, 2017  2322 Creatinine: 0.95 [CG]  2322 WBC: 10.0 [CG]  2322 Hemoglobin:  15.2 [CG]  2322 Appearance: CLEAR [CG]  2323 Hgb urine dipstick: NEGATIVE [CG]  2323 Bilirubin Urine: NEGATIVE [CG]  2323 Nitrite: NEGATIVE [CG]  2323 RBC / HPF: 0-5 [CG]    Clinical Course User Index [CG] Liberty Handy, PA-C   Patient is a 46 y.o. male with a hx of T2DM, morbid obesity, HTN, anxiety, depression, OSA who presents to the ED with left sided back pain that started yesterday after patient had been vacuuming and cleaning his home.   On exam pt has VSS, abdominal exam negative without guarding, rebound or rigidity.  Negative Murphy's and McBurney's.  No suprapubic or CVAT.  Musculoskeletal exam revealed decreased R hip flexion and external rotation due to pain in L lower back.  +Lumbar paraspinal muscle tenderness to deep palpation, left sciatic notch and SI joint tender.  Patient did not tolerate standing up or laying down for a more thorough back exam.  No neurological deficit to lower extremities. Patient is ambulatory. Initial ddx include lumbar strain, psoas muscle strain or spasm and less likely ruptured disc, UTI/pyelo, PID, kidney stone, cauda equina or epidural abscess.  No red flag symptoms of back pain including: fecal incontinence, urinary retention or overflow incontinence, night sweats, waking from sleep with back pain, unexplained fevers or weight loss, h/o cancer, IVDU, recent trauma. No concern for cauda equina, epidural abscess, or other serious cause of back pain. CBC, CMP, U/A negative today.  Imaging not indicated today as abdominal and MSK exam and lab work up grossly normal.  Suspect pt experiencing psoas muscle spasm or strain.  Conservative measures such as ice/heat, mild stretches, muscle relaxant and ibuprofen indicated with PCP follow-up if no improvement with conservative management. ED return precautions discussed with pt who verbalized understanding and was agreeable to dispo plan.   Final Clinical Impressions(s) / ED Diagnoses   Final diagnoses:    Muscle spasm of back    New Prescriptions Discharge Medication List as of 02/16/2017  6:22 PM    START taking these medications   Details  methocarbamol (ROBAXIN) 500 MG tablet Take 1 tablet (500 mg total) by mouth 2 (two) times daily., Starting Thu 02/16/2017, Print    naproxen (NAPROSYN) 500 MG tablet Take 1 tablet (500 mg total) by mouth 2 (two) times daily., Starting Thu 02/16/2017, Print       I personally performed the services described in this documentation, which was scribed in my presence. The recorded information has been reviewed and is accurate.     Liberty Handy, PA-C 02/19/17 2329    Vincent Collin  Effie Shy, MD 02/20/17 4540

## 2017-02-16 NOTE — ED Triage Notes (Addendum)
Pt here with lower back pain radiates to left back. Pt had been cleaning house yesterday, mopping etc.  Pt left home and went to store, when he got home pain started.  Pain is with movement. No numbness/tingling in legs/feet.  No urinary sx

## 2017-02-16 NOTE — Discharge Instructions (Signed)
Your blood work today was normal. Your left-sided back pain is most likely due to spasms of the deep muscles in your back. We will treat this with a muscle relaxer (robaxin), and anti-inflammatory and pain medicine (naproxen) for the next 5 days.  Please avoid aggravating activities for the next two days.  Avoid sitting down or laying down for prolonged periods of times as this will only worsen your back stiffness, tightness and pain.  After two days of rest, you can start doing light back stretches and massages. A heating pad is also helpful with muscle spasms.

## 2017-05-12 ENCOUNTER — Encounter (HOSPITAL_COMMUNITY): Payer: Self-pay | Admitting: Emergency Medicine

## 2017-05-12 ENCOUNTER — Emergency Department (HOSPITAL_COMMUNITY)
Admission: EM | Admit: 2017-05-12 | Discharge: 2017-05-12 | Disposition: A | Discharge status: Home / Self Care | Payer: 59 | Attending: Emergency Medicine | Admitting: Emergency Medicine

## 2017-05-12 DIAGNOSIS — E039 Hypothyroidism, unspecified: Secondary | ICD-10-CM | POA: Insufficient documentation

## 2017-05-12 DIAGNOSIS — E119 Type 2 diabetes mellitus without complications: Secondary | ICD-10-CM | POA: Insufficient documentation

## 2017-05-12 DIAGNOSIS — T783XXA Angioneurotic edema, initial encounter: Secondary | ICD-10-CM

## 2017-05-12 DIAGNOSIS — Z7984 Long term (current) use of oral hypoglycemic drugs: Secondary | ICD-10-CM | POA: Insufficient documentation

## 2017-05-12 DIAGNOSIS — I10 Essential (primary) hypertension: Secondary | ICD-10-CM | POA: Diagnosis not present

## 2017-05-12 DIAGNOSIS — R600 Localized edema: Secondary | ICD-10-CM | POA: Diagnosis present

## 2017-05-12 MED ORDER — HYDROCHLOROTHIAZIDE 12.5 MG PO TABS: 12.5000 mg | ORAL_TABLET | Freq: Every day | ORAL | 0 refills | Status: DC

## 2017-05-12 MED ORDER — FAMOTIDINE IN NACL 20-0.9 MG/50ML-% IV SOLN
20.0000 mg | Freq: Once | INTRAVENOUS | Status: AC
  Administered 2017-05-12: 20 mg via INTRAVENOUS
  Filled 2017-05-12: qty 50

## 2017-05-12 MED ORDER — METHYLPREDNISOLONE SODIUM SUCC 125 MG IJ SOLR
125.0000 mg | Freq: Once | INTRAMUSCULAR | Status: AC
  Administered 2017-05-12: 125 mg via INTRAVENOUS
  Filled 2017-05-12: qty 2

## 2017-05-12 MED ORDER — DIPHENHYDRAMINE HCL 50 MG/ML IJ SOLN
25.0000 mg | Freq: Once | INTRAMUSCULAR | Status: AC
  Administered 2017-05-12: 25 mg via INTRAVENOUS
  Filled 2017-05-12: qty 1

## 2017-05-12 MED ORDER — PREDNISONE 20 MG PO TABS: 40.0000 mg | ORAL_TABLET | Freq: Every day | ORAL | 0 refills | Status: DC

## 2017-05-12 NOTE — ED Provider Notes (Signed)
[facility]-EMERGENCY DEPT Provider Note   CSN: [id number] Arrival date & time: 05/12/17  1847     History   Chief Complaint Chief Complaint  Patient presents with  . Facial Swelling    HPI Vincent Ruiz is a 46 y.o. male.  Patient is a 46 year old man with a history of diabetes, hypertension and depression presenting today with swelling of his upper lip. Patient states around 2:30 today he started noticed tingling in his lip. It has progressively become more swollen. He is also starting to feel some mild tingling in the right side of his tongue but denies any shortness of breath or difficulty swallowing.  And on lisinopril for approximately 4-5 years.     The history is provided by the patient.    Past Medical History:  Diagnosis Date  . Anxiety   . Depression   . Diabetes mellitus without complication (HCC)   . Hypertension   . Sleep apnea   . Thyroid disease     Patient Active Problem List   Diagnosis Date Noted  . Bipolar 2 disorder, major depressive episode (HCC) 07/03/2016  . Intentional benzodiazepine overdose (HCC) 07/02/2016  . Hypertension 07/02/2016  . Morbid obesity (HCC) 07/02/2016  . Type 2 diabetes mellitus (HCC) 07/02/2016  . Hypothyroidism 07/02/2016  . Sleep apnea on CPAP 07/02/2016    Past Surgical History:  Procedure Laterality Date  . KNEE SURGERY         Home Medications    Prior to Admission medications   Medication Sig Start Date End Date Taking? Authorizing Provider  escitalopram (LEXAPRO) 20 MG tablet Take 20 mg by mouth daily.    [provider]  lamoTRIgine (LAMICTAL) 100 MG tablet Take 100 mg by mouth daily.    [provider]  levothyroxine (SYNTHROID, LEVOTHROID) 100 MCG tablet Take 100 mcg by mouth daily before breakfast.    [provider]  lisinopril-hydrochlorothiazide (PRINZIDE,ZESTORETIC) 10-12.5 MG tablet Take 2 tablets by mouth daily. 07/04/16   Leatha GildingGherghe, Costin M, MD  metFORMIN (GLUCOPHAGE) 500  MG tablet Take 500 mg by mouth 2 (two) times daily with a meal.    [provider]  methocarbamol (ROBAXIN) 500 MG tablet Take 1 tablet (500 mg total) by mouth 2 (two) times daily. 02/16/17   Liberty HandyGibbons, Claudia J, PA-C  naproxen (NAPROSYN) 500 MG tablet Take 1 tablet (500 mg total) by mouth 2 (two) times daily. 02/16/17   Liberty HandyGibbons, Claudia J, PA-C  temazepam (RESTORIL) 15 MG capsule Take 15 mg by mouth at bedtime as needed for sleep.    [provider]  testosterone cypionate (DEPOTESTOSTERONE CYPIONATE) 200 MG/ML injection Inject 100 mg into the muscle once a week.    [provider]  traMADol (ULTRAM) 50 MG tablet Take 50 mg by mouth every 6 (six) hours as needed for moderate pain or severe pain.    [provider]  VITAMIN D, CHOLECALCIFEROL, PO Take 1 capsule by mouth daily.    [provider]    Family History History reviewed. No pertinent family history.  Social History Social History  Substance Use Topics  . Smoking status: Never Smoker  . Smokeless tobacco: Never Used  . Alcohol use No     Allergies   Penicillins   Review of Systems Review of Systems  All other systems reviewed and are negative.    Physical Exam Updated Vital Signs BP (!) 150/86   Pulse 95   Temp 98.4 F (36.9 C)   Resp 20  SpO2 96%   Physical Exam  Constitutional: He is oriented to person, place, and time. He appears well-developed and well-nourished. No distress.  Morbidly obese  HENT:  Head: Normocephalic and atraumatic.  Mouth/Throat: Oropharynx is clear and moist.  Significant swelling of the upper lip. No evidence of swelling to the tongue on visual inspection. Mallenpotti 4.  No notable uvula swelling  Eyes: Conjunctivae and EOM are normal. Pupils are equal, round, and reactive to light.  Neck: Normal range of motion. Neck supple.  Cardiovascular: Normal rate, regular rhythm and intact distal pulses.   No murmur heard. Pulmonary/Chest: Effort  normal and breath sounds normal. No respiratory distress. He has no wheezes. He has no rales.  Musculoskeletal: Normal range of motion. He exhibits no edema or tenderness.  Neurological: He is alert and oriented to person, place, and time.  Skin: Skin is warm and dry. No rash noted. No erythema.  Psychiatric: He has a normal mood and affect. His behavior is normal.  Nursing note and vitals reviewed.    ED Treatments / Results  Labs (all labs ordered are listed, but only abnormal results are displayed) Labs Reviewed - No data to display  EKG  EKG Interpretation None       Radiology No results found.  Procedures Procedures (including critical care time)  Medications Ordered in ED Medications  methylPREDNISolone sodium succinate (SOLU-MEDROL) 125 mg/2 mL injection 125 mg (not administered)  diphenhydrAMINE (BENADRYL) injection 25 mg (not administered)  famotidine (PEPCID) IVPB 20 mg premix (not administered)     Initial Impression / Assessment and Plan / ED Course  I have reviewed the triage vital signs and the nursing notes.  Pertinent labs & imaging results that were available during my care of the patient were reviewed by me and considered in my medical decision making (see chart for details).     Patient presenting with symptoms consistent with angioedema most likely from his ACE inhibitor lisinopril. He has significant swelling of the upper lip. No notable tongue swelling and he denies any shortness of breath or difficulty swallowing. He states the right side of his tongue feels unusual but there is no visible swelling.  Patient given Benadryl, Solu-Medrol and Pepcid. Will observe.  9:05 PM Patient states his tongue feels better. No worsening of the lip swelling  Final Clinical Impressions(s) / ED Diagnoses   Final diagnoses:  Angioedema, initial encounter    New Prescriptions Discharge Medication List as of 05/12/2017  9:34 PM    START taking these  medications   Details  hydrochlorothiazide (HYDRODIURIL) 12.5 MG tablet Take 1 tablet (12.5 mg total) by mouth daily., Starting Fri 05/12/2017, Print    predniSONE (DELTASONE) 20 MG tablet Take 2 tablets (40 mg total) by mouth daily., Starting Fri 05/12/2017, Print         Gwyneth Sprout, MD 05/12/17 [phone]

## 2017-05-12 NOTE — ED Triage Notes (Signed)
Patient c/o lip and tongue swelling progressively worsening x4 hours. Denies SOB and difficulty breathing. Reports taking lisinopril.

## 2017-05-12 NOTE — Discharge Instructions (Signed)
If lip not swollen in the morning don't take steroids but if it is swollen then start prednisone.  Return immediately if develop tongue swelling or trouble breathing. Don't ever take lisinopril or anything in that same drug family again.

## 2017-05-12 NOTE — ED Notes (Signed)
ED Provider at bedside. 

## 2020-07-13 ENCOUNTER — Other Ambulatory Visit: Payer: Self-pay

## 2020-07-13 ENCOUNTER — Ambulatory Visit (INDEPENDENT_AMBULATORY_CARE_PROVIDER_SITE_OTHER): Payer: BC Managed Care – PPO | Admitting: Internal Medicine

## 2020-07-13 ENCOUNTER — Encounter (INDEPENDENT_AMBULATORY_CARE_PROVIDER_SITE_OTHER): Payer: Self-pay | Admitting: Internal Medicine

## 2020-07-13 VITALS — BP 180/100 | HR 101 | Temp 97.2°F | Ht 70.0 in | Wt >= 6400 oz

## 2020-07-13 DIAGNOSIS — I10 Essential (primary) hypertension: Secondary | ICD-10-CM

## 2020-07-13 DIAGNOSIS — G4730 Sleep apnea, unspecified: Secondary | ICD-10-CM | POA: Diagnosis not present

## 2020-07-13 DIAGNOSIS — E119 Type 2 diabetes mellitus without complications: Secondary | ICD-10-CM

## 2020-07-13 DIAGNOSIS — E559 Vitamin D deficiency, unspecified: Secondary | ICD-10-CM

## 2020-07-13 DIAGNOSIS — Z125 Encounter for screening for malignant neoplasm of prostate: Secondary | ICD-10-CM

## 2020-07-13 DIAGNOSIS — N529 Male erectile dysfunction, unspecified: Secondary | ICD-10-CM

## 2020-07-13 MED ORDER — AMLODIPINE BESYLATE 5 MG PO TABS: 5.0000 mg | ORAL_TABLET | Freq: Every day | ORAL | 3 refills | Status: DC

## 2020-07-13 NOTE — Patient Instructions (Signed)
[facility] Dietary Recommendations for Weight Loss What to Avoid . Avoid added sugars o Often added sugar can be found in processed foods such as many condiments, dry cereals, cakes, cookies, chips, crisps, crackers, candies, sweetened drinks, etc.  o Read labels and AVOID/DECREASE use of foods with the following in their ingredient list: Sugar, fructose, high fructose corn syrup, sucrose, glucose, maltose, dextrose, molasses, cane sugar, brown sugar, any type of syrup, agave nectar, etc.   . Avoid snacking in between meals . Avoid foods made with flour o If you are going to eat food made with flour, choose those made with whole-grains; and, minimize your consumption as much as is tolerable . Avoid processed foods o These foods are generally stocked in the middle of the grocery store. Focus on shopping on the perimeter of the grocery.  . Avoid Meat  o We recommend following a plant-based diet at [facility]. Thus, we recommend avoiding meat as a general rule. Consider eating beans, legumes, eggs, and/or dairy products for regular protein sources o If you plan on eating meat limit to 4 ounces of meat at a time and choose lean options such as Fish, chicken, turkey. Avoid red meat intake such as pork and/or steak What to Include . Vegetables o GREEN LEAFY VEGETABLES: Kale, spinach, mustard greens, collard greens, cabbage, broccoli, etc. o OTHER: Asparagus, cauliflower, eggplant, carrots, peas, Brussel sprouts, tomatoes, bell peppers, zucchini, beets, cucumbers, etc. . Grains, seeds, and legumes o Beans: kidney beans, black eyed peas, garbanzo beans, black beans, pinto beans, etc. o Whole, unrefined grains: brown rice, barley, bulgur, oatmeal, etc. . Healthy fats  o Avoid highly processed fats such as vegetable oil o Examples of healthy fats: avocado, olives, virgin olive oil, dark chocolate (?72% Cocoa), nuts (peanuts, almonds, walnuts, cashews, pecans, etc.) . None to Low  Intake of Animal Sources of Protein o Meat sources: chicken, turkey, salmon, tuna. Limit to 4 ounces of meat at one time. o Consider limiting dairy sources, but when choosing dairy focus on: PLAIN Greek yogurt, cottage cheese, high-protein milk . Fruit o Choose berries  When to Eat . Intermittent Fasting: o Choosing not to eat for a specific time period, but DO FOCUS ON HYDRATION when fasting o Multiple Techniques: - Time Restricted Eating: eat 3 meals in a day, each meal lasting no more than 60 minutes, no snacks between meals - 16-18 hour fast: fast for 16 to 18 hours up to 7 days a week. Often suggested to start with 2-3 nonconsecutive days per week.  . Remember the time you sleep is counted as fasting.  . Examples of eating schedule: Fast from 7:00pm-11:00am. Eat between 11:00am-7:00pm.  - 24-hour fast: fast for 24 hours up to every other day. Often suggested to start with 1 day per week . Remember the time you sleep is counted as fasting . Examples of eating schedule:  o Eating day: eat 2-3 meals on your eating day. If doing 2 meals, each meal should last no more than 90 minutes. If doing 3 meals, each meal should last no more than 60 minutes. Finish last meal by 7:00pm. o Fasting day: Fast until 7:00pm.  o IF YOU FEEL UNWELL FOR ANY REASON/IN ANY WAY WHEN FASTING, STOP FASTING BY EATING A NUTRITIOUS SNACK OR LIGHT MEAL o ALWAYS FOCUS ON HYDRATION DURING FASTS - Acceptable Hydration sources: water, broths, tea/coffee (black tea/coffee is best but using a small amount of whole-fat dairy products in coffee/tea is acceptable).  -   Poor Hydration Sources: anything with sugar or artificial sweeteners added to it  These recommendations have been developed for patients that are actively receiving medical care from either Dr. Bard Haupert or Sarah Gray, DNP, NP-C at [facility]. These recommendations are developed for patients with specific medical conditions and are not meant to be  distributed or used by others that are not actively receiving care from either provider listed above at [facility]. It is not appropriate to participate in the above eating plans without proper medical supervision.   Reference: Fung, J. The obesity code. [place]: [place]; 2016.   

## 2020-07-13 NOTE — Progress Notes (Signed)
Metrics: Intervention Frequency ACO  Documented Smoking Status Yearly  Screened one or more times in 24 months  Cessation Counseling or  Active cessation medication Past 24 months  Past 24 months   Guideline developer: UpToDate (See UpToDate for funding source) Date Released: 2014       Wellness Office Visit  Subjective:  Patient ID: Vincent Ruiz, male    DOB: 1971-10-05  Age: 49 y.o. MRN: [id number]  CC: This 49 year old man comes in as a new patient to establish care. HPI  I had seen him more than 3 years ago and he lost his insurance and has not seen any physician since that time. He has a history of morbid obesity, hypertension, type 2 diabetes and sleep apnea. He now wishes to establish care again. He is also complaining of erectile dysfunction.  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   Social History   Tobacco Use   Smoking status: Never Smoker   Smokeless tobacco: Never Used  Substance Use Topics   Alcohol use: No    Current Meds  Medication Sig   Cholecalciferol (VITAMIN D3) 10000 units capsule Take 10,000 Units by mouth daily.       No flowsheet data found.   Objective:   Today's Vitals: BP (!) 180/100 (BP Location: Left Arm, Patient Position: Sitting, Cuff Size: Normal)    Pulse 101    Temp (!) 97.2 F (36.2 C) (Temporal)    Ht '5\' 10"'  (1.778 m)    Wt (!) 411 lb 12.8 oz (186.8 kg)    SpO2 91%    BMI 59.09 kg/m  Vitals with BMI 07/13/2020 05/12/2017 05/12/2017   Height '5\' 10"'  - -  Weight 411 lbs 13 oz - -  BMI 57.84 - -  Systolic 696 295 284  Diastolic 132 73 82  Pulse 101 79 85     Physical Exam  He is morbidly obese.  He has uncontrolled hypertension.  He is alert and orientated without any focal neurological signs.     Assessment   1. Essential hypertension   2. Morbid obesity ([place])   3. Type 2 diabetes mellitus without complication, without long-term current use of insulin ([place])   4. Sleep apnea, unspecified type   5. Erectile dysfunction, unspecified erectile dysfunction type   6. Special screening for malignant neoplasm of prostate   7. Vitamin D deficiency disease       Tests ordered Orders Placed This Encounter  Procedures   CBC   COMPLETE METABOLIC PANEL WITH GFR   Hemoglobin A1c   Lipid panel   PSA, Total with Reflex to PSA, Free   T3, free   T4   TSH   Testosterone Total,Free,Bio, Males   VITAMIN D 25 Hydroxy (Vit-D Deficiency, Fractures)     Plan: 1. I am going to start him on amlodipine for his uncontrolled hypertension at a moderate dose to make sure his blood pressure does not drop  precipitously. 2. Blood work is ordered. 3. I discussed with him the importance of losing weight and become healthier.  We discussed the concept of intermittent fasting, I explained the rationale for this and I have given him a diet sheet.  I encouraged that he drink at least a gallon and a half of water every day. 4. Further recommendations will depend on blood results and will discuss these with him in a month's time when I see him next time.   Meds ordered this encounter  Medications   amLODipine (NORVASC) 5 MG tablet    Sig: Take 1 tablet (5 mg total) by mouth daily.    Dispense:  30 tablet    Refill:  3    Eustacio Ellen Luther Parody, MD

## 2020-07-14 LAB — COMPLETE METABOLIC PANEL WITH GFR
AG Ratio: 1.1 (calc) (ref 1.0–2.5)
ALT: 16 U/L (ref 9–46)
AST: 17 U/L (ref 10–40)
Albumin: 3.7 g/dL (ref 3.6–5.1)
Alkaline phosphatase (APISO): 71 U/L (ref 36–130)
BUN: 12 mg/dL (ref 7–25)
CO2: 26 mmol/L (ref 20–32)
Calcium: 9.4 mg/dL (ref 8.6–10.3)
Chloride: 101 mmol/L (ref 98–110)
Creat: 0.85 mg/dL (ref 0.60–1.35)
GFR, Est African American: 121 mL/min/{1.73_m2} (ref >=60)
GFR, Est Non African American: 104 mL/min/{1.73_m2} (ref >=60)
Globulin: 3.4 g/dL (calc) (ref 1.9–3.7)
Glucose, Bld: 341 mg/dL — ABNORMAL HIGH (ref 65–99)
Potassium: 4.1 mmol/L (ref 3.5–5.3)
Sodium: 134 mmol/L — ABNORMAL LOW (ref 135–146)
Total Bilirubin: 0.9 mg/dL (ref 0.2–1.2)
Total Protein: 7.1 g/dL (ref 6.1–8.1)

## 2020-07-14 LAB — TESTOSTERONE TOTAL,FREE,BIO, MALES
Albumin: 3.7 g/dL (ref 3.6–5.1)
Sex Hormone Binding: 36 nmol/L (ref 10–50)
Testosterone: 155 ng/dL — ABNORMAL LOW (ref 250–827)

## 2020-07-14 LAB — CBC
HCT: 44.6 % (ref 38.5–50.0)
Hemoglobin: 15 g/dL (ref 13.2–17.1)
MCH: 31.1 pg (ref 27.0–33.0)
MCHC: 33.6 g/dL (ref 32.0–36.0)
MCV: 92.5 fL (ref 80.0–100.0)
MPV: 10.5 fL (ref 7.5–12.5)
Platelets: 250 10*3/uL (ref 140–400)
RBC: 4.82 10*6/uL (ref 4.20–5.80)
RDW: 12.1 % (ref 11.0–15.0)
WBC: 9.5 10*3/uL (ref 3.8–10.8)

## 2020-07-14 LAB — LIPID PANEL
Cholesterol: 194 mg/dL (ref <200)
HDL: 43 mg/dL (ref >=40)
LDL Cholesterol (Calc): 121 mg/dL (calc) — ABNORMAL HIGH
Non-HDL Cholesterol (Calc): 151 mg/dL (calc) — ABNORMAL HIGH (ref <130)
Total CHOL/HDL Ratio: 4.5 (calc) (ref <5.0)
Triglycerides: 187 mg/dL — ABNORMAL HIGH (ref <150)

## 2020-07-14 LAB — HEMOGLOBIN A1C
Hgb A1c MFr Bld: 11.9 % of total Hgb — ABNORMAL HIGH (ref <5.7)
Mean Plasma Glucose: 295 (calc)
eAG (mmol/L): 16.3 (calc)

## 2020-07-14 LAB — T3, FREE: T3, Free: 3 pg/mL (ref 2.3–4.2)

## 2020-07-14 LAB — T4: T4, Total: 11.3 ug/dL — ABNORMAL HIGH (ref 4.9–10.5)

## 2020-07-14 LAB — VITAMIN D 25 HYDROXY (VIT D DEFICIENCY, FRACTURES): Vit D, 25-Hydroxy: 24 ng/mL — ABNORMAL LOW (ref 30–100)

## 2020-07-14 LAB — TSH: TSH: 1.93 mIU/L (ref 0.40–4.50)

## 2020-07-14 LAB — PSA, TOTAL WITH REFLEX TO PSA, FREE: PSA, Total: 0.4 ng/mL (ref <=4.0)

## 2020-08-31 ENCOUNTER — Ambulatory Visit (INDEPENDENT_AMBULATORY_CARE_PROVIDER_SITE_OTHER): Payer: BC Managed Care – PPO | Admitting: Internal Medicine

## 2020-08-31 ENCOUNTER — Other Ambulatory Visit: Payer: Self-pay

## 2020-08-31 ENCOUNTER — Encounter (INDEPENDENT_AMBULATORY_CARE_PROVIDER_SITE_OTHER): Payer: Self-pay | Admitting: Internal Medicine

## 2020-08-31 DIAGNOSIS — N529 Male erectile dysfunction, unspecified: Secondary | ICD-10-CM

## 2020-08-31 DIAGNOSIS — I10 Essential (primary) hypertension: Secondary | ICD-10-CM | POA: Diagnosis not present

## 2020-08-31 DIAGNOSIS — E291 Testicular hypofunction: Secondary | ICD-10-CM | POA: Diagnosis not present

## 2020-08-31 DIAGNOSIS — E119 Type 2 diabetes mellitus without complications: Secondary | ICD-10-CM | POA: Diagnosis not present

## 2020-08-31 DIAGNOSIS — E559 Vitamin D deficiency, unspecified: Secondary | ICD-10-CM

## 2020-08-31 LAB — PROLACTIN: Prolactin: 6.2 ng/mL (ref 2.0–18.0)

## 2020-08-31 MED ORDER — RYBELSUS 3 MG PO TABS: 3.0000 mg | ORAL_TABLET | Freq: Every day | ORAL | 3 refills | Status: DC

## 2020-08-31 NOTE — Patient Instructions (Signed)
[facility] Dietary Recommendations for Weight Loss What to Avoid . Avoid added sugars o Often added sugar can be found in processed foods such as many condiments, dry cereals, cakes, cookies, chips, crisps, crackers, candies, sweetened drinks, etc.  o Read labels and AVOID/DECREASE use of foods with the following in their ingredient list: Sugar, fructose, high fructose corn syrup, sucrose, glucose, maltose, dextrose, molasses, cane sugar, brown sugar, any type of syrup, agave nectar, etc.   . Avoid snacking in between meals . Avoid foods made with flour o If you are going to eat food made with flour, choose those made with whole-grains; and, minimize your consumption as much as is tolerable . Avoid processed foods o These foods are generally stocked in the middle of the grocery store. Focus on shopping on the perimeter of the grocery.  . Avoid Meat  o We recommend following a plant-based diet at [facility]. Thus, we recommend avoiding meat as a general rule. Consider eating beans, legumes, eggs, and/or dairy products for regular protein sources o If you plan on eating meat limit to 4 ounces of meat at a time and choose lean options such as Fish, chicken, turkey. Avoid red meat intake such as pork and/or steak What to Include . Vegetables o GREEN LEAFY VEGETABLES: Kale, spinach, mustard greens, collard greens, cabbage, broccoli, etc. o OTHER: Asparagus, cauliflower, eggplant, carrots, peas, Brussel sprouts, tomatoes, bell peppers, zucchini, beets, cucumbers, etc. . Grains, seeds, and legumes o Beans: kidney beans, black eyed peas, garbanzo beans, black beans, pinto beans, etc. o Whole, unrefined grains: brown rice, barley, bulgur, oatmeal, etc. . Healthy fats  o Avoid highly processed fats such as vegetable oil o Examples of healthy fats: avocado, olives, virgin olive oil, dark chocolate (?72% Cocoa), nuts (peanuts, almonds, walnuts, cashews, pecans, etc.) . None to Low  Intake of Animal Sources of Protein o Meat sources: chicken, turkey, salmon, tuna. Limit to 4 ounces of meat at one time. o Consider limiting dairy sources, but when choosing dairy focus on: PLAIN Greek yogurt, cottage cheese, high-protein milk . Fruit o Choose berries  When to Eat . Intermittent Fasting: o Choosing not to eat for a specific time period, but DO FOCUS ON HYDRATION when fasting o Multiple Techniques: - Time Restricted Eating: eat 3 meals in a day, each meal lasting no more than 60 minutes, no snacks between meals - 16-18 hour fast: fast for 16 to 18 hours up to 7 days a week. Often suggested to start with 2-3 nonconsecutive days per week.  . Remember the time you sleep is counted as fasting.  . Examples of eating schedule: Fast from 7:00pm-11:00am. Eat between 11:00am-7:00pm.  - 24-hour fast: fast for 24 hours up to every other day. Often suggested to start with 1 day per week . Remember the time you sleep is counted as fasting . Examples of eating schedule:  o Eating day: eat 2-3 meals on your eating day. If doing 2 meals, each meal should last no more than 90 minutes. If doing 3 meals, each meal should last no more than 60 minutes. Finish last meal by 7:00pm. o Fasting day: Fast until 7:00pm.  o IF YOU FEEL UNWELL FOR ANY REASON/IN ANY WAY WHEN FASTING, STOP FASTING BY EATING A NUTRITIOUS SNACK OR LIGHT MEAL o ALWAYS FOCUS ON HYDRATION DURING FASTS - Acceptable Hydration sources: water, broths, tea/coffee (black tea/coffee is best but using a small amount of whole-fat dairy products in coffee/tea is acceptable).  -   Poor Hydration Sources: anything with sugar or artificial sweeteners added to it  These recommendations have been developed for patients that are actively receiving medical care from either Dr. Arth Nicastro or Sarah Gray, DNP, NP-C at [facility]. These recommendations are developed for patients with specific medical conditions and are not meant to be  distributed or used by others that are not actively receiving care from either provider listed above at [facility]. It is not appropriate to participate in the above eating plans without proper medical supervision.   Reference: Fung, J. The obesity code. [place]: [place]; 2016.   

## 2020-08-31 NOTE — Progress Notes (Addendum)
Metrics: Intervention Frequency ACO  Documented Smoking Status Yearly  Screened one or more times in 24 months  Cessation Counseling or  Active cessation medication Past 24 months  Past 24 months   Guideline developer: UpToDate (See UpToDate for funding source) Date Released: 2014       Wellness Office Visit  Subjective:  Patient ID: Vincent Ruiz, male    DOB: 02/10/1971  Age: 49 y.o. MRN: [id number]  CC: This man comes in for follow-up of diabetes, morbid obesity and discussion of his blood work. HPI I reviewed all the blood work with him.  He has vitamin D deficiency. His diabetes is extremely poorly controlled with a hemoglobin A1c of 11.9%. His testosterone levels are also extremely low close to being 150 or less.  He does not have any visual symptoms or headaches regarding the possibility of a pituitary tumor. He previously was on testosterone therapy several years ago when his testosterone levels were not as low as this. The rest of his blood work is fairly unremarkable. Since the last time I saw him, he has improved his nutrition and has been doing intermittent fasting and as a result he has begun to lose weight.  Past Medical History:  Diagnosis Date  . Anxiety   . Diabetes mellitus without complication ([place])   . Hypertension   . Sleep apnea    Past Surgical History:  Procedure Laterality Date  . KNEE SURGERY Left 2005     Family History  Problem Relation Age of Onset  . Diabetes Mother   . Hypertension Mother   . 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 Smoking status: Never Smoker  . Smokeless tobacco: Never Used  Substance Use Topics  . Alcohol use: No    Current Meds  Medication Sig  . amLODipine (NORVASC) 5 MG tablet Take 1 tablet (5 mg total) by mouth daily.  . Cholecalciferol (VITAMIN D3) 10000  units capsule Take 10,000 Units by mouth daily.      No flowsheet data found.   Objective:   Today's Vitals: BP (!) 144/82 (BP Location: Right Arm, Patient Position: Sitting, Cuff Size: Normal)   Pulse 99   Temp (!) 97.1 F (36.2 C) (Temporal)   Resp 18   Ht _0  (1.778 m)   Wt (!) 398 lb (180.5 kg)   SpO2 98%   BMI 57.11 kg/m  Vitals with BMI 08/31/2020 07/13/2020 05/12/2017  Height _1  _2  -  Weight 398 lbs 411 lbs 13 oz -  BMI 96.22 29.79 -  Systolic 892 119 417  Diastolic 82 408 73  Pulse 99 101 79     Physical Exam  He remains morbidly obese but he has lost 13 pounds since the last time I saw him.  This is good work.  His blood pressure has also improved.     Assessment   1. Morbid obesity ([place])   2. Type 2 diabetes mellitus without complication, without long-term current use of insulin ([place])   3. Essential hypertension   4. Erectile dysfunction, unspecified erectile dysfunction type   5. Vitamin D deficiency disease   6. Hypogonadism in male       Tests ordered Orders Placed This Encounter  Procedures  . Prolactin     Plan: 1.  Continue to work on nutrition and today  we discussed the blue zones and the importance of a plant-based diet and staying away from animal proteins, focusing on nuts and beans on a daily basis along with other variety of vegetables and some fruits.  He also should try to do intermittent fasting on a more prolonged basis if he can tolerated bearing in mind that he needs to stay hydrated and also the importance of salt in his water. 2.  He will continue with the same dose of amlodipine for his hypertension.  His blood pressure is coming under better control now that he is losing weight. 3.  In view of his very low testosterone levels, I am going to check a prolactin level to make sure there is no evidence regarding the possibility of a pituitary tumor.  If his prolactin levels are elevated, he will need to have an MRI brain  scan.  If his prolactin level is not elevated, he will be a candidate of testosterone therapy and I will prescribe this for him at this time. 4.  As far as his obesity and diabetes is concerned, I am going to start him on Rybelsus. 5.  I recommended he start taking vitamin D3 10,000 units daily. 6.  Follow-up in 2 months to see how he is doing and we may need to repeat blood work then.     Meds ordered this encounter  Medications  . Semaglutide (RYBELSUS) 3 MG TABS    Sig: Take 3 mg by mouth daily.    Dispense:  30 tablet    Refill:  3   Addendum: Prolactin level received and was in the normal range. This rules out the possibility of a pituitary tumor and we can proceed with testosterone therapy if the patient wishes. Doree Albee, MD

## 2020-09-01 ENCOUNTER — Other Ambulatory Visit (INDEPENDENT_AMBULATORY_CARE_PROVIDER_SITE_OTHER): Payer: Self-pay | Admitting: Internal Medicine

## 2020-09-01 MED ORDER — TESTOSTERONE CYPIONATE 200 MG/ML IM SOLN: 100.0000 mg | INTRAMUSCULAR | 0 refills | Status: DC

## 2020-09-08 ENCOUNTER — Telehealth (INDEPENDENT_AMBULATORY_CARE_PROVIDER_SITE_OTHER): Payer: Self-pay

## 2020-09-08 ENCOUNTER — Other Ambulatory Visit (INDEPENDENT_AMBULATORY_CARE_PROVIDER_SITE_OTHER): Payer: Self-pay | Admitting: Internal Medicine

## 2020-09-08 MED ORDER — TRULICITY 0.75 MG/0.5ML ~~LOC~~ SOAJ: 0.7500 mg | SUBCUTANEOUS | 3 refills | Status: DC

## 2020-09-08 NOTE — Telephone Encounter (Signed)
Let the patient know that I have sent another prescription for Trulicity which is an injection he takes underneath the skin (subcutaneous) once a week.  According to what I can see, this is a preferred medication on his insurance.

## 2020-09-29 ENCOUNTER — Other Ambulatory Visit (INDEPENDENT_AMBULATORY_CARE_PROVIDER_SITE_OTHER): Payer: Self-pay | Admitting: Internal Medicine

## 2020-09-29 ENCOUNTER — Telehealth (INDEPENDENT_AMBULATORY_CARE_PROVIDER_SITE_OTHER): Payer: Self-pay

## 2020-09-29 MED ORDER — METFORMIN HCL 500 MG PO TABS: 500.0000 mg | ORAL_TABLET | Freq: Two times a day (BID) | ORAL | 0 refills | Status: DC

## 2020-09-29 NOTE — Telephone Encounter (Signed)
Ok, sent

## 2020-09-29 NOTE — Telephone Encounter (Signed)
Pt said the DM meds are too costly you sent for both RX. Will you put him back on metformin instead. Send to [facility] [address]

## 2020-10-05 NOTE — Telephone Encounter (Signed)
Pt will go to the pharmacy today to pick up. He was not sure what the plan was next. Will start today.

## 2020-10-06 ENCOUNTER — Telehealth (INDEPENDENT_AMBULATORY_CARE_PROVIDER_SITE_OTHER): Payer: Self-pay

## 2020-10-06 NOTE — Telephone Encounter (Signed)
Please update the note with 08/31/20 to show prolactin levels & whether he can start the testosterone injections.

## 2020-10-06 NOTE — Telephone Encounter (Signed)
Okay, addendum done at the very end of the note.

## 2020-10-06 NOTE — Telephone Encounter (Signed)
Pt approval came back for the Testosterone [employer] [facility]. Dates are: 10/19/20021-10/06/2023. (22yrs) Copy will be sent to scan into records for future references.

## 2020-11-02 ENCOUNTER — Ambulatory Visit (INDEPENDENT_AMBULATORY_CARE_PROVIDER_SITE_OTHER): Payer: BC Managed Care – PPO | Admitting: Internal Medicine

## 2020-11-06 ENCOUNTER — Other Ambulatory Visit (INDEPENDENT_AMBULATORY_CARE_PROVIDER_SITE_OTHER): Payer: Self-pay | Admitting: Internal Medicine

## 2020-12-08 ENCOUNTER — Other Ambulatory Visit: Payer: Self-pay

## 2020-12-08 ENCOUNTER — Ambulatory Visit (INDEPENDENT_AMBULATORY_CARE_PROVIDER_SITE_OTHER): Payer: BC Managed Care – PPO | Admitting: Internal Medicine

## 2020-12-08 ENCOUNTER — Encounter (INDEPENDENT_AMBULATORY_CARE_PROVIDER_SITE_OTHER): Payer: Self-pay | Admitting: Internal Medicine

## 2020-12-08 VITALS — BP 138/78 | HR 85 | Temp 97.7°F | Resp 18 | Ht 70.0 in | Wt 392.0 lb

## 2020-12-08 DIAGNOSIS — E119 Type 2 diabetes mellitus without complications: Secondary | ICD-10-CM | POA: Diagnosis not present

## 2020-12-08 DIAGNOSIS — E559 Vitamin D deficiency, unspecified: Secondary | ICD-10-CM

## 2020-12-08 DIAGNOSIS — E291 Testicular hypofunction: Secondary | ICD-10-CM

## 2020-12-08 DIAGNOSIS — I10 Essential (primary) hypertension: Secondary | ICD-10-CM

## 2020-12-08 MED ORDER — HYDROCHLOROTHIAZIDE 25 MG PO TABS: 25.0000 mg | ORAL_TABLET | Freq: Every day | ORAL | 3 refills | Status: DC

## 2020-12-08 MED ORDER — NP THYROID 60 MG PO TABS: 60.0000 mg | ORAL_TABLET | Freq: Every day | ORAL | 3 refills | Status: DC

## 2020-12-08 NOTE — Progress Notes (Signed)
Metrics: Intervention Frequency ACO  Documented Smoking Status Yearly  Screened one or more times in 24 months  Cessation Counseling or  Active cessation medication Past 24 months  Past 24 months   Guideline developer: UpToDate (See UpToDate for funding source) Date Released: 2014       Wellness Office Visit  Subjective:  Patient ID: Vincent Ruiz, male    DOB: 1971/01/07  Age: 49 y.o. MRN: [id number]  CC: This man comes in for follow-up of diabetes, morbid obesity, hypertension and vitamin D deficiency as well as hypogonadism. HPI  He started testosterone therapy in September and he does feel somewhat better but not as well as he would like to. He complains of fatigue still combined with degree of focus and concentration problem. He has been trying to be consistent with nutrition. Unfortunately, Rybelsus cost too much money so he continues with Metformin for his diabetes. He describes some bilateral leg swelling and he takes amlodipine Past Medical History:  Diagnosis Date  . Anxiety   . Diabetes mellitus without complication ([place])   . Hypertension   . Sleep apnea    Past Surgical History:  Procedure Laterality Date  . KNEE SURGERY Left 2005     Family History  Problem Relation Age of Onset  . Diabetes Mother   . Hypertension Mother   . 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 Smoking status: Never Smoker  . Smokeless tobacco: Never Used  Substance Use Topics  . Alcohol use: No    Current Meds  Medication Sig  . amLODipine (NORVASC) 5 MG tablet TAKE 1 TABLET BY MOUTH EVERY DAY  . Cholecalciferol (VITAMIN D3) 10000 units capsule Take 10,000 Units by mouth daily.  . metFORMIN (GLUCOPHAGE) 500 MG tablet Take 1 tablet (500 mg total) by mouth 2 (two) times daily with a meal.  . testosterone cypionate  (DEPO-TESTOSTERONE) 200 MG/ML injection Inject 0.5 mLs (100 mg total) into the muscle every 7 (seven) days.      No flowsheet data found.   Objective:   Today's Vitals: BP 138/78 (BP Location: Left Arm, Patient Position: Sitting, Cuff Size: Normal)   Pulse 85   Temp 97.7 F (36.5 C) (Temporal)   Resp 18   Ht '5\' 10"'  (1.778 m)   Wt (!) 392 lb (177.8 kg)   SpO2 98%   BMI 56.25 kg/m  Vitals with BMI 12/08/2020 08/31/2020 07/13/2020  Height '5\' 10"'  '5\' 10"'  '5\' 10"'   Weight 392 lbs 398 lbs 411 lbs 13 oz  BMI 56.25 40.98 11.91  Systolic 478 295 621  Diastolic 78 82 308  Pulse 85 99 101     Physical Exam  He remains morbidly obese but has lost 7 pounds since September.  Blood pressure borderline elevated still.  Alert and orientated without any focal neuro signs     Assessment   1. Type 2 diabetes mellitus without complication, without long-term current use of insulin ([place])   2. Morbid obesity ([place])   3. Essential hypertension   4. Vitamin D deficiency disease   5. Hypogonadism in male       Tests ordered Orders Placed This Encounter  Procedures  . Microalbumin, urine  . COMPLETE METABOLIC PANEL WITH GFR  . VITAMIN D 25 Hydroxy (Vit-D Deficiency, Fractures)  . Hemoglobin A1c     Plan: 1.  He will continue with Metformin and continue to work on nutrition as before. 2. I think he does have symptoms of thyroid hypofunction and he clearly has insulin resistance so I will prescribe desiccated NP thyroid, off label, for his symptoms and insulin resistance which has been shown to be helped in the medical literature by desiccated thyroid. 3. He will continue with vitamin D3 10,000 units daily and we'll check levels today. 4. As far as his hypogonadism is concerned, I recommended to increase the dose so that he will be taking testosterone injections 100 mg intramuscular twice a week now. 5. His hypertension is not optimally controlled and I will add hydrochlorothiazide 25 mg  daily.  This is also helped to some degree some edema that he might be getting in his legs. 6. Follow-up in about 6 weeks   Meds ordered this encounter  Medications  . NP THYROID 60 MG tablet    Sig: Take 1 tablet (60 mg total) by mouth daily before breakfast.    Dispense:  30 tablet    Refill:  3  . hydrochlorothiazide (HYDRODIURIL) 25 MG tablet    Sig: Take 1 tablet (25 mg total) by mouth daily.    Dispense:  30 tablet    Refill:  3    Buzz Axel Luther Parody, MD

## 2020-12-09 ENCOUNTER — Other Ambulatory Visit (INDEPENDENT_AMBULATORY_CARE_PROVIDER_SITE_OTHER): Payer: Self-pay | Admitting: Internal Medicine

## 2020-12-09 LAB — COMPLETE METABOLIC PANEL WITH GFR
AG Ratio: 1.1 (calc) (ref 1.0–2.5)
ALT: 18 U/L (ref 9–46)
AST: 19 U/L (ref 10–40)
Albumin: 3.9 g/dL (ref 3.6–5.1)
Alkaline phosphatase (APISO): 56 U/L (ref 36–130)
BUN: 15 mg/dL (ref 7–25)
CO2: 31 mmol/L (ref 20–32)
Calcium: 9.6 mg/dL (ref 8.6–10.3)
Chloride: 100 mmol/L (ref 98–110)
Creat: 0.97 mg/dL (ref 0.60–1.35)
GFR, Est African American: 107 mL/min/{1.73_m2} (ref >=60)
GFR, Est Non African American: 93 mL/min/{1.73_m2} (ref >=60)
Globulin: 3.6 g/dL (calc) (ref 1.9–3.7)
Glucose, Bld: 158 mg/dL — ABNORMAL HIGH (ref 65–139)
Potassium: 4.2 mmol/L (ref 3.5–5.3)
Sodium: 138 mmol/L (ref 135–146)
Total Bilirubin: 0.8 mg/dL (ref 0.2–1.2)
Total Protein: 7.5 g/dL (ref 6.1–8.1)

## 2020-12-09 LAB — HEMOGLOBIN A1C
Hgb A1c MFr Bld: 11.1 % of total Hgb — ABNORMAL HIGH (ref <5.7)
Mean Plasma Glucose: 272 mg/dL
eAG (mmol/L): 15.1 mmol/L

## 2020-12-09 LAB — VITAMIN D 25 HYDROXY (VIT D DEFICIENCY, FRACTURES): Vit D, 25-Hydroxy: 50 ng/mL (ref 30–100)

## 2020-12-09 MED ORDER — METFORMIN HCL 500 MG PO TABS: 1000.0000 mg | ORAL_TABLET | Freq: Two times a day (BID) | ORAL | 3 refills | Status: DC

## 2021-01-02 ENCOUNTER — Other Ambulatory Visit (INDEPENDENT_AMBULATORY_CARE_PROVIDER_SITE_OTHER): Payer: Self-pay | Admitting: Internal Medicine

## 2021-01-08 ENCOUNTER — Other Ambulatory Visit (INDEPENDENT_AMBULATORY_CARE_PROVIDER_SITE_OTHER): Payer: Self-pay | Admitting: Internal Medicine

## 2021-01-18 NOTE — Telephone Encounter (Signed)
Close encounter 

## 2021-01-27 ENCOUNTER — Ambulatory Visit (INDEPENDENT_AMBULATORY_CARE_PROVIDER_SITE_OTHER): Payer: BC Managed Care – PPO | Admitting: Internal Medicine

## 2021-03-08 ENCOUNTER — Other Ambulatory Visit: Payer: Self-pay

## 2021-03-08 ENCOUNTER — Encounter (INDEPENDENT_AMBULATORY_CARE_PROVIDER_SITE_OTHER): Payer: Self-pay | Admitting: Internal Medicine

## 2021-03-08 ENCOUNTER — Other Ambulatory Visit (INDEPENDENT_AMBULATORY_CARE_PROVIDER_SITE_OTHER): Payer: Self-pay | Admitting: Internal Medicine

## 2021-03-08 ENCOUNTER — Ambulatory Visit (INDEPENDENT_AMBULATORY_CARE_PROVIDER_SITE_OTHER): Payer: BC Managed Care – PPO | Admitting: Internal Medicine

## 2021-03-08 VITALS — BP 132/82 | HR 91 | Temp 97.7°F | Ht 70.0 in | Wt 388.2 lb

## 2021-03-08 DIAGNOSIS — E119 Type 2 diabetes mellitus without complications: Secondary | ICD-10-CM

## 2021-03-08 DIAGNOSIS — N529 Male erectile dysfunction, unspecified: Secondary | ICD-10-CM

## 2021-03-08 DIAGNOSIS — E291 Testicular hypofunction: Secondary | ICD-10-CM | POA: Diagnosis not present

## 2021-03-08 DIAGNOSIS — Z125 Encounter for screening for malignant neoplasm of prostate: Secondary | ICD-10-CM

## 2021-03-08 DIAGNOSIS — I10 Essential (primary) hypertension: Secondary | ICD-10-CM

## 2021-03-08 MED ORDER — TADALAFIL 20 MG PO TABS: 10.0000 mg | ORAL_TABLET | ORAL | 3 refills | Status: DC | PRN

## 2021-03-08 NOTE — Progress Notes (Signed)
Metrics: Intervention Frequency ACO  Documented Smoking Status Yearly  Screened one or more times in 24 months  Cessation Counseling or  Active cessation medication Past 24 months  Past 24 months   Guideline developer: UpToDate (See UpToDate for funding source) Date Released: 2014       Wellness Office Visit  Subjective:  Patient ID: Vincent Ruiz, male    DOB: Dec 07, 1971  Age: 50 y.o. MRN: [id number]  CC: This man comes in for follow-up of morbid obesity, hypogonadism, diabetes, hypertension and erectile dysfunction. HPI  He tells me that the erectile dysfunction is still bothering him despite increasing testosterone levels.  His energy levels are much improved with testosterone therapy. His nutrition has also improved and he continues to lose weight, albeit somewhat slower than before. He continues on Metformin 1000 mg twice a day. He also continues on desiccated NP thyroid, off label, for symptoms of thyroid hypofunction. He also continues with amlodipine and hydrochlorothiazide for his hypertension. Past Medical History:  Diagnosis Date  . Anxiety   . Diabetes mellitus without complication ([place])   . Hypertension   . Sleep apnea    Past Surgical History:  Procedure Laterality Date  . KNEE SURGERY Left 2005     Family History  Problem Relation Age of Onset  . Diabetes Mother   . Hypertension Mother   . 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 Smoking status: Never Smoker  . Smokeless tobacco: Never Used  Substance Use Topics  . Alcohol use: No    Current Meds  Medication Sig  . amLODipine (NORVASC) 5 MG tablet TAKE 1 TABLET BY MOUTH EVERY DAY  . Cholecalciferol (VITAMIN D3) 10000 units capsule Take 10,000 Units by mouth daily.  . hydrochlorothiazide (HYDRODIURIL) 25 MG tablet Take 1 tablet (25 mg total) by  mouth daily.  . metFORMIN (GLUCOPHAGE) 500 MG tablet TAKE 1 TABLET BY MOUTH 2 TIMES DAILY WITH A MEAL. (Patient taking differently: Take 1,000 mg by mouth in the morning and at bedtime.)  . NP THYROID 60 MG tablet Take 1 tablet (60 mg total) by mouth daily before breakfast.  . tadalafil (CIALIS) 20 MG tablet Take 0.5-1 tablets (10-20 mg total) by mouth every other day as needed for erectile dysfunction.  Marland Kitchen testosterone cypionate (DEPOTESTOSTERONE CYPIONATE) 200 MG/ML injection INJECT 0.5 MLS (100 MG TOTAL) INTO THE MUSCLE EVERY 7 (SEVEN) DAYS. (Patient taking differently: Inject 100 mg into the muscle 2 (two) times a week.)     [place] Office Visit from 03/08/2021 in [facility]  PHQ-9 Total Score 0      Objective:   Today's Vitals: BP 132/82   Pulse 91   Temp 97.7 F (36.5 C) (Temporal)   Ht _0  (1.778 m)   Wt (!) 388 lb 3.2 oz (176.1 kg)   SpO2 97%   BMI 55.70 kg/m  Vitals with BMI 03/08/2021 12/08/2020 08/31/2020  Height _1  _2  _3   Weight 388 lbs 3 oz 392 lbs 398 lbs  BMI 55.7 74.16 38.45  Systolic 364 680 321  Diastolic 82 78 82  Pulse 91 85 99     Physical Exam   He remains morbidly obese but he has lost another 4 pounds in weight since last visit.  Blood pressure is somewhat better now.  He is alert and  orientated without any focal neurological signs.    Assessment   1. Type 2 diabetes mellitus without complication, without long-term current use of insulin ([place])   2. Morbid obesity ([place])   3. Essential hypertension   4. Hypogonadism in male   5. Erectile dysfunction, unspecified erectile dysfunction type   6. Special screening for malignant neoplasm of prostate       Tests ordered Orders Placed This Encounter  Procedures  . T3, free  . T4, free  . TSH  . Testosterone Total,Free,Bio, Males  . PSA, Total with Reflex to PSA, Free  . DHEA-sulfate  . COMPLETE METABOLIC PANEL WITH GFR  . Hemoglobin A1c      Plan: 1. Continue with Metformin for his diabetes and we will check an A1c. 2. Continue with amlodipine and hydrochlorothiazide and will and we will check renal function. 3. Continue with testosterone therapy twice a week and we will check testosterone levels.  His last injection was 3 days ago so it will be interesting to see what his levels are now and we will try to further optimize. 4. In terms of his erectile dysfunction, apart from improving testosterone levels, improving diabetic control and obesity, he may well benefit from specific erectile dysfunction drug so we will try him on Cialis. 5. Follow-up in about 3 months and further recommendations will depend on blood results.   Meds ordered this encounter  Medications  . tadalafil (CIALIS) 20 MG tablet    Sig: Take 0.5-1 tablets (10-20 mg total) by mouth every other day as needed for erectile dysfunction.    Dispense:  10 tablet    Refill:  3    Nimish Luther Parody, MD

## 2021-03-09 ENCOUNTER — Other Ambulatory Visit (INDEPENDENT_AMBULATORY_CARE_PROVIDER_SITE_OTHER): Payer: Self-pay | Admitting: Internal Medicine

## 2021-03-09 LAB — TESTOSTERONE TOTAL,FREE,BIO, MALES
Albumin: 3.8 g/dL (ref 3.6–5.1)
Sex Hormone Binding: 29 nmol/L (ref 10–50)
Testosterone, Bioavailable: 304.3 ng/dL (ref >=110.0)
Testosterone, Free: 173.7 pg/mL (ref 46.0–224.0)
Testosterone: 933 ng/dL — ABNORMAL HIGH (ref 250–827)

## 2021-03-09 LAB — PSA, TOTAL WITH REFLEX TO PSA, FREE: PSA, Total: 0.6 ng/mL (ref <=4.0)

## 2021-03-09 LAB — COMPLETE METABOLIC PANEL WITH GFR
AG Ratio: 1 (calc) (ref 1.0–2.5)
ALT: 23 U/L (ref 9–46)
AST: 21 U/L (ref 10–40)
Albumin: 3.8 g/dL (ref 3.6–5.1)
Alkaline phosphatase (APISO): 53 U/L (ref 36–130)
BUN: 16 mg/dL (ref 7–25)
CO2: 27 mmol/L (ref 20–32)
Calcium: 9.5 mg/dL (ref 8.6–10.3)
Chloride: 96 mmol/L — ABNORMAL LOW (ref 98–110)
Creat: 1.06 mg/dL (ref 0.60–1.35)
GFR, Est African American: 96 mL/min/{1.73_m2} (ref >=60)
GFR, Est Non African American: 83 mL/min/{1.73_m2} (ref >=60)
Globulin: 3.7 g/dL (calc) (ref 1.9–3.7)
Glucose, Bld: 327 mg/dL — ABNORMAL HIGH (ref 65–139)
Potassium: 4.6 mmol/L (ref 3.5–5.3)
Sodium: 136 mmol/L (ref 135–146)
Total Bilirubin: 1.1 mg/dL (ref 0.2–1.2)
Total Protein: 7.5 g/dL (ref 6.1–8.1)

## 2021-03-09 LAB — HEMOGLOBIN A1C
Hgb A1c MFr Bld: 11.8 % of total Hgb — ABNORMAL HIGH (ref <5.7)
Mean Plasma Glucose: 292 mg/dL
eAG (mmol/L): 16.2 mmol/L

## 2021-03-09 LAB — T4, FREE: Free T4: 1.4 ng/dL (ref 0.8–1.8)

## 2021-03-09 LAB — DHEA-SULFATE: DHEA-SO4: 333 ug/dL (ref 61–442)

## 2021-03-09 LAB — T3, FREE: T3, Free: 3.3 pg/mL (ref 2.3–4.2)

## 2021-03-09 LAB — TSH: TSH: 1.52 mIU/L (ref 0.40–4.50)

## 2021-03-10 ENCOUNTER — Other Ambulatory Visit (INDEPENDENT_AMBULATORY_CARE_PROVIDER_SITE_OTHER): Payer: Self-pay | Admitting: Internal Medicine

## 2021-03-10 MED ORDER — RYBELSUS 3 MG PO TABS: 3.0000 mg | ORAL_TABLET | Freq: Every day | ORAL | 3 refills | Status: DC

## 2021-03-11 ENCOUNTER — Other Ambulatory Visit (INDEPENDENT_AMBULATORY_CARE_PROVIDER_SITE_OTHER): Payer: Self-pay | Admitting: Internal Medicine

## 2021-03-15 ENCOUNTER — Other Ambulatory Visit (INDEPENDENT_AMBULATORY_CARE_PROVIDER_SITE_OTHER): Payer: Self-pay | Admitting: Internal Medicine

## 2021-03-15 ENCOUNTER — Encounter (INDEPENDENT_AMBULATORY_CARE_PROVIDER_SITE_OTHER): Payer: Self-pay | Admitting: Internal Medicine

## 2021-03-15 MED ORDER — LIRAGLUTIDE 18 MG/3ML ~~LOC~~ SOPN: 0.6000 mg | PEN_INJECTOR | Freq: Every day | SUBCUTANEOUS | 1 refills | Status: DC

## 2021-03-15 MED ORDER — TRULICITY 0.75 MG/0.5ML ~~LOC~~ SOAJ: 0.7500 mg | SUBCUTANEOUS | 3 refills | Status: DC

## 2021-03-15 NOTE — Progress Notes (Signed)
Please call this patient and read out what I have sent on my chart.  Thanks.

## 2021-03-22 ENCOUNTER — Other Ambulatory Visit (INDEPENDENT_AMBULATORY_CARE_PROVIDER_SITE_OTHER): Payer: Self-pay

## 2021-03-22 MED ORDER — TESTOSTERONE CYPIONATE 200 MG/ML IM SOLN
100.0000 mg | INTRAMUSCULAR | 3 refills | Status: DC
Start: 2021-03-22 — End: 2023-02-15

## 2021-03-22 NOTE — Progress Notes (Signed)
Pt was called and given notifications on voicemail. Instructed if he does not understand to call back to the office today.

## 2021-03-29 ENCOUNTER — Other Ambulatory Visit (INDEPENDENT_AMBULATORY_CARE_PROVIDER_SITE_OTHER): Payer: Self-pay | Admitting: Internal Medicine

## 2021-06-14 ENCOUNTER — Other Ambulatory Visit: Payer: Self-pay

## 2021-06-14 ENCOUNTER — Encounter (INDEPENDENT_AMBULATORY_CARE_PROVIDER_SITE_OTHER): Payer: Self-pay | Admitting: Internal Medicine

## 2021-06-14 ENCOUNTER — Ambulatory Visit (INDEPENDENT_AMBULATORY_CARE_PROVIDER_SITE_OTHER): Payer: BC Managed Care – PPO | Admitting: Internal Medicine

## 2021-06-14 VITALS — BP 136/80 | HR 104 | Temp 97.7°F | Resp 18 | Ht 70.0 in | Wt 367.0 lb

## 2021-06-14 DIAGNOSIS — E119 Type 2 diabetes mellitus without complications: Secondary | ICD-10-CM

## 2021-06-14 DIAGNOSIS — E291 Testicular hypofunction: Secondary | ICD-10-CM

## 2021-06-14 DIAGNOSIS — I10 Essential (primary) hypertension: Secondary | ICD-10-CM

## 2021-06-14 DIAGNOSIS — E559 Vitamin D deficiency, unspecified: Secondary | ICD-10-CM

## 2021-06-14 NOTE — Progress Notes (Signed)
338 lb to 367 lb ; feels great.

## 2021-06-14 NOTE — Progress Notes (Signed)
Metrics: Intervention Frequency ACO  Documented Smoking Status Yearly  Screened one or more times in 24 months  Cessation Counseling or  Active cessation medication Past 24 months  Past 24 months   Guideline developer: UpToDate (See UpToDate for funding source) Date Released: 2014       Wellness Office Visit  Subjective:  Patient ID: Vincent Ruiz, male    DOB: 05-16-1971  Age: 50 y.o. MRN: [id number]  CC: This man comes in for follow-up of morbid obesity, diabetes which was previously uncontrolled, hypogonadism and hypertension. HPI  He has done extremely well with nutrition and has lost further weight.  He feels good in terms of energy as he continues to lose weight and also testosterone therapy is helping him. He continues to take desiccated NP thyroid, off label, for symptoms of thyroid hypofunction. He continues on Trulicity and metformin for his diabetes. He continues on amlodipine and hydrochlorothiazide for his hypertension. 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   Dulaglutide (TRULICITY) 2.59 DG/3.8VF SOPN Inject 0.75 mg into the skin once a week.   hydrochlorothiazide (HYDRODIURIL) 25 MG tablet TAKE 1 TABLET (25 MG TOTAL) BY MOUTH DAILY.    metFORMIN (GLUCOPHAGE) 500 MG tablet TAKE 2 TABLETS (1,000 MG TOTAL) BY MOUTH 2 (TWO) TIMES DAILY WITH A MEAL.   NP THYROID 60 MG tablet TAKE 1 TABLET (60 MG TOTAL) BY MOUTH DAILY BEFORE BREAKFAST.   sildenafil (VIAGRA) 100 MG tablet Take 1 tablet (100 mg total) by mouth as needed for erectile dysfunction.   testosterone cypionate (DEPOTESTOSTERONE CYPIONATE) 200 MG/ML injection Inject 0.5 mLs (100 mg total) into the muscle 2 (two) times a week.     [place] Office Visit from 03/08/2021 in [facility]  PHQ-9 Total Score 0       Objective:   Today's Vitals: BP 136/80 (BP Location: Left Arm, Patient Position: Sitting, Cuff Size: Large)   Pulse (!) 104   Temp 97.7 F (36.5 C) (Temporal)   Resp 18   Ht '5\' 10"'  (1.778 m)   Wt (!) 367 lb (166.5 kg)   SpO2 99%   BMI 52.66 kg/m  Vitals with BMI 06/14/2021 03/08/2021 12/08/2020  Height '5\' 10"'  '5\' 10"'  '5\' 10"'   Weight 367 lbs 388 lbs 3 oz 392 lbs  BMI 52.66 64.3 32.95  Systolic 188 416 606  Diastolic 80 82 78  Pulse 301 91 85     Physical Exam  Although he remains morbidly obese, he has lost another 21 pounds which is fantastic work.     Assessment   1. Type 2 diabetes mellitus without complication, without long-term current use of  insulin ([place])   2. Morbid obesity ([place])   3. Hypogonadism in male   4. Essential hypertension   5. Vitamin D deficiency disease       Tests ordered Orders Placed This Encounter  Procedures   Basic metabolic panel   Hemoglobin A1c      Plan: 1.  Continue with Trulicity and metformin and we will check an A1c today. 2.  Continue with testosterone therapy for his hypogonadism as before. 3.  Continue with antihypertensive medications as before and we will check renal function. 4.  I will see him in about 3 months time for follow-up.  Further recommendations will depend on blood results    No orders of the defined types were placed in this encounter.   Doree Albee,  MD

## 2021-06-15 LAB — BASIC METABOLIC PANEL
BUN: 16 mg/dL (ref 7–25)
CO2: 26 mmol/L (ref 20–32)
Calcium: 9.4 mg/dL (ref 8.6–10.3)
Chloride: 101 mmol/L (ref 98–110)
Creat: 1.1 mg/dL (ref 0.60–1.35)
Glucose, Bld: 118 mg/dL — ABNORMAL HIGH (ref 65–99)
Potassium: 4 mmol/L (ref 3.5–5.3)
Sodium: 136 mmol/L (ref 135–146)

## 2021-06-15 LAB — HEMOGLOBIN A1C
Hgb A1c MFr Bld: 8.7 % of total Hgb — ABNORMAL HIGH (ref <5.7)
Mean Plasma Glucose: 203 mg/dL
eAG (mmol/L): 11.2 mmol/L

## 2021-07-07 DIAGNOSIS — Z20822 Contact with and (suspected) exposure to covid-19: Secondary | ICD-10-CM | POA: Diagnosis not present

## 2021-07-09 ENCOUNTER — Other Ambulatory Visit (INDEPENDENT_AMBULATORY_CARE_PROVIDER_SITE_OTHER): Payer: Self-pay | Admitting: Internal Medicine

## 2021-07-09 MED ORDER — METFORMIN HCL 500 MG PO TABS: 1000.0000 mg | ORAL_TABLET | Freq: Two times a day (BID) | ORAL | 0 refills | Status: DC

## 2021-08-24 DIAGNOSIS — Z20822 Contact with and (suspected) exposure to covid-19: Secondary | ICD-10-CM | POA: Diagnosis not present

## 2021-09-20 ENCOUNTER — Ambulatory Visit (INDEPENDENT_AMBULATORY_CARE_PROVIDER_SITE_OTHER): Payer: BC Managed Care – PPO | Admitting: Internal Medicine

## 2021-10-03 ENCOUNTER — Ambulatory Visit
Admission: EM | Admit: 2021-10-03 | Discharge: 2021-10-03 | Disposition: A | Discharge status: Home / Self Care | Payer: BC Managed Care – PPO | Attending: Physician Assistant | Admitting: Physician Assistant

## 2021-10-03 ENCOUNTER — Other Ambulatory Visit: Payer: Self-pay

## 2021-10-03 DIAGNOSIS — E039 Hypothyroidism, unspecified: Secondary | ICD-10-CM | POA: Diagnosis not present

## 2021-10-03 DIAGNOSIS — E119 Type 2 diabetes mellitus without complications: Secondary | ICD-10-CM

## 2021-10-03 DIAGNOSIS — I10 Essential (primary) hypertension: Secondary | ICD-10-CM

## 2021-10-03 MED ORDER — METFORMIN HCL 500 MG PO TABS: 1000.0000 mg | ORAL_TABLET | Freq: Two times a day (BID) | ORAL | 0 refills | Status: DC

## 2021-10-03 MED ORDER — AMLODIPINE BESYLATE 5 MG PO TABS: 5.0000 mg | ORAL_TABLET | Freq: Every day | ORAL | 0 refills | Status: DC

## 2021-10-03 MED ORDER — NP THYROID 60 MG PO TABS: 60.0000 mg | ORAL_TABLET | Freq: Every day | ORAL | 0 refills | Status: DC

## 2021-10-03 MED ORDER — HYDROCHLOROTHIAZIDE 25 MG PO TABS: 25.0000 mg | ORAL_TABLET | Freq: Every day | ORAL | 0 refills | Status: DC

## 2021-10-03 MED ORDER — TRULICITY 0.75 MG/0.5ML ~~LOC~~ SOAJ: 0.7500 mg | SUBCUTANEOUS | 0 refills | Status: DC

## 2021-10-03 NOTE — ED Provider Notes (Signed)
[facility]    CSN: [id number] Arrival date & time: 10/03/21  0809      History   Chief Complaint Chief Complaint  Patient presents with   Medication Refill    HPI Vincent Ruiz is a 50 y.o. male.   Patient here today for request of refills after passing of his PCP.  He reports he is unable to get in to new PCP until next month.  He needs refills of his blood pressure medication as well as treatment for diabetes and hypothyroidism.  He reports she has been taking these medications without any side effects or concerns.  He denies any chest pain or shortness of breath.  He states his blood glucose levels have been around 120-130.  He denies any hypoglycemia.   The history is provided by the patient.  Medication Refill  Past Medical History:  Diagnosis Date   Anxiety    Diabetes mellitus without complication ([place])    Hypertension    Sleep apnea     Patient Active Problem List   Diagnosis Date Noted   Bipolar 2 disorder, major depressive episode ([place]) 07/03/2016   Intentional benzodiazepine overdose ([place]) 07/02/2016   Hypertension 07/02/2016   Morbid obesity ([place]) 07/02/2016   Type 2 diabetes mellitus ([place]) 07/02/2016   Sleep apnea on CPAP 07/02/2016    Past Surgical History:  Procedure Laterality Date   KNEE SURGERY Left 2005       Home Medications    Prior to Admission medications   Medication Sig Start Date End Date Taking? Authorizing Provider  Cholecalciferol (VITAMIN D3) 10000 units capsule Take 10,000 Units by mouth daily.   Yes [provider]  sildenafil (VIAGRA) 100 MG tablet Take 1 tablet (100 mg total) by mouth as needed for erectile dysfunction. 03/09/21  Yes Gosrani, Nimish C, MD  testosterone cypionate (DEPOTESTOSTERONE CYPIONATE) 200 MG/ML injection Inject 0.5 mLs (100 mg total) into the muscle 2 (two) times a week. 03/22/21  Yes Gosrani, Nimish C, MD  amLODipine (NORVASC) 5 MG tablet Take 1 tablet (5 mg total) by mouth  daily. 10/03/21   Francene Finders, PA-C  Dulaglutide (TRULICITY) 0.92 ZR/0.0TM SOPN Inject 0.75 mg into the skin once a week. 10/03/21   Francene Finders, PA-C  hydrochlorothiazide (HYDRODIURIL) 25 MG tablet Take 1 tablet (25 mg total) by mouth daily. 10/03/21   Francene Finders, PA-C  metFORMIN (GLUCOPHAGE) 500 MG tablet Take 2 tablets (1,000 mg total) by mouth 2 (two) times daily with a meal. 10/03/21   Francene Finders, PA-C  NP THYROID 60 MG tablet Take 1 tablet (60 mg total) by mouth daily before breakfast. 10/03/21   Francene Finders, PA-C    Family History Family History  Problem Relation Age of Onset   Diabetes Mother    Hypertension Mother    Multiple myeloma Sister     Social History Social History   Tobacco Use   Smoking status: Never   Smokeless tobacco: Never  Vaping Use   Vaping Use: Never used  Substance Use Topics   Alcohol use: No   Drug use: No     Allergies   Penicillins   Review of Systems Review of Systems  Constitutional:  Negative for chills and fever.  Eyes:  Negative for discharge and redness.  Respiratory:  Negative for shortness of breath and wheezing.   Cardiovascular:  Negative for chest pain.  Skin:  Positive for color change and wound.  Neurological:  Negative for  numbness.    Physical Exam Triage Vital Signs ED Triage Vitals  Enc Vitals Group     BP 10/03/21 0826 (!) 158/82     Pulse Rate 10/03/21 0826 78     Resp 10/03/21 0826 18     Temp 10/03/21 0826 98 F (36.7 C)     Temp Source 10/03/21 0826 Oral     SpO2 --      Weight --      Height --      Head Circumference --      Peak Flow --      Pain Score 10/03/21 0824 0     Pain Loc --      Pain Edu? --      Excl. in [place]? --    No data found.  Updated Vital Signs BP (!) 158/82 (BP Location: Left Arm)   Pulse 78   Temp 98 F (36.7 C) (Oral)   Resp 18   Physical Exam Vitals and nursing note reviewed.  Constitutional:      General: He is not in acute distress.     Appearance: Normal appearance. He is not ill-appearing.  HENT:     Head: Normocephalic and atraumatic.  Eyes:     Conjunctiva/sclera: Conjunctivae normal.  Cardiovascular:     Rate and Rhythm: Normal rate and regular rhythm.     Heart sounds: Normal heart sounds. No murmur heard. Pulmonary:     Effort: Pulmonary effort is normal. No respiratory distress.     Breath sounds: Normal breath sounds. No wheezing, rhonchi or rales.  Neurological:     Mental Status: He is alert.  Psychiatric:        Mood and Affect: Mood normal.        Behavior: Behavior normal.        Thought Content: Thought content normal.     UC Treatments / Results  Labs (all labs ordered are listed, but only abnormal results are displayed) Labs Reviewed - No data to display  EKG   Radiology No results found.  Procedures Procedures (including critical care time)  Medications Ordered in UC Medications - No data to display  Initial Impression / Assessment and Plan / UC Course  I have reviewed the triage vital signs and the nursing notes.  Pertinent labs & imaging results that were available during my care of the patient were reviewed by me and considered in my medical decision making (see chart for details).  Medications refilled at prior doses for 1 month until he is able to get in with new PCP.  Encouraged follow-up with any further concerns.  Final Clinical Impressions(s) / UC Diagnoses   Final diagnoses:  Type 2 diabetes mellitus without complication, without long-term current use of insulin ([employer])  Primary hypertension  Hypothyroidism, unspecified type   Discharge Instructions   None    ED Prescriptions     Medication Sig Dispense Auth. Provider   amLODipine (NORVASC) 5 MG tablet Take 1 tablet (5 mg total) by mouth daily. 30 tablet Ewell Poe F, PA-C   hydrochlorothiazide (HYDRODIURIL) 25 MG tablet Take 1 tablet (25 mg total) by mouth daily. 30 tablet Ewell Poe F, PA-C   metFORMIN  (GLUCOPHAGE) 500 MG tablet Take 2 tablets (1,000 mg total) by mouth 2 (two) times daily with a meal. 120 tablet Francene Finders, PA-C   NP THYROID 60 MG tablet Take 1 tablet (60 mg total) by mouth daily before breakfast. 30 tablet Francene Finders, PA-C  Dulaglutide (TRULICITY) 8.26 WU/6.4UM SOPN Inject 0.75 mg into the skin once a week. 2 mL Francene Finders, PA-C      PDMP not reviewed this encounter.   Francene Finders, PA-C 10/03/21 [phone]

## 2021-10-03 NOTE — ED Triage Notes (Signed)
Pt states his previous PCP passed away and new PCP can't see him for a month.  Requesting a refill of Trulicity, Metformin, Amlodipine, Testosterone injection, HCTZ, NP Thyroid.   Mostly needs Amlodipine (out today), Thyroid and HCTZ only has four left.  The others he has some for a few weeks.

## 2021-10-04 ENCOUNTER — Telehealth: Payer: Self-pay | Admitting: Emergency Medicine

## 2021-10-04 NOTE — Telephone Encounter (Signed)
Patient was seen yesterday requesting a refill on all of his medications due to the fact his PCP passed away.  All of his medications were refilled but patient's insurance is requesting a 90 day supply for his thyroid medication.  Please advise.  [facility] [address]

## 2021-10-05 NOTE — Telephone Encounter (Signed)
Patient informed of Haley's recommendation.  Patient will contact Lurena Joiner tomorrow 10/06/2021 to see if she will write for a 90 day supply.  Will forward message to [place].

## 2021-10-25 NOTE — Progress Notes (Signed)
Virtual Visit via Telephone Note  I connected with Vincent Ruiz, on 10/29/2021 at 3:51 PM by telephone due to the COVID-19 pandemic and verified that I am speaking with the correct person using two identifiers.  Due to current restrictions/limitations of in-office visits due to the COVID-19 pandemic, this scheduled clinical appointment was converted to a telehealth visit.   Consent: I discussed the limitations, risks, security and privacy concerns of performing an evaluation and management service by telephone and the availability of in person appointments. I also discussed with the patient that there may be a patient responsible charge related to this service. The patient expressed understanding and agreed to proceed.   Location of Patient: Home  Location of Provider: [facility] [place] participating in Telemedicine visit: Willey Blade, NP [place] Else, [place]   History of Present Illness: Vincent Ruiz. Vincent Ruiz is a 50 year-old male who presents to establish care.   Current issues and/or concerns: DIABETES TYPE 2: 10/16/20222 at [facility] per PA note: Medications refilled at prior doses for 1 month until he is able to get in with new PCP.  Encouraged follow-up with any further concerns.  10/29/2021: Last A1C:  8.7% on 06/15/2021 Med Adherence:  [x]  Yes    []  No Medication side effects:  []  Yes    [x]  No Home Monitoring?  [x]  Yes    []  No Home glucose results range:110's-120's Diet Adherence: [x]  Yes    []  No Exercise: [x]  Yes    []  No  2. HYPERTENSION: Currently taking: see medication list Med Adherence: [x]  Yes    []  No Medication side effects: []  Yes    [x]  No Adherence with salt restriction (low-salt diet): [x]  Yes  []  No Exercise: Yes [x]  No []  Home Monitoring?: []  Yes    [x]  No Monitoring Frequency: []  Yes    [x]  No Home BP results range: []  Yes    [x]  No Smoking []  Yes [x]   No SOB? []  Yes    [x]  No Chest Pain?: []  Yes    [x]  No  Past Medical History:  Diagnosis Date   Anxiety    Diabetes mellitus without complication (HCC)    Hypertension    Sleep apnea    Allergies  Allergen Reactions   Penicillins Anaphylaxis    Has patient had a PCN reaction causing immediate rash, facial/tongue/throat swelling, SOB or lightheadedness with hypotension: Yes Has patient had a PCN reaction causing severe rash involving mucus membranes or skin necrosis: Yes Has patient had a PCN reaction that required hospitalization Yes Has patient had a PCN reaction occurring within the last 10 years: No If all of the above answers are "NO", then may proceed with Cephalosporin use.     Current Outpatient Medications on File Prior to Visit  Medication Sig Dispense Refill   amLODipine (NORVASC) 5 MG tablet Take 1 tablet (5 mg total) by mouth daily. 30 tablet 0   Cholecalciferol (VITAMIN D3) 10000 units capsule Take 10,000 Units by mouth daily.     Dulaglutide (TRULICITY) A999333 [id number] SOPN Inject 0.75 mg into the skin once a week. 2 mL 0   hydrochlorothiazide (HYDRODIURIL) 25 MG tablet Take 1 tablet (25 mg total) by mouth daily. 30 tablet 0   metFORMIN (GLUCOPHAGE) 500 MG tablet Take 2 tablets (1,000 mg total) by mouth 2 (two) times daily with a meal. 120 tablet 0   NP THYROID 60 MG tablet  Take 1 tablet (60 mg total) by mouth daily before breakfast. 30 tablet 0   sildenafil (VIAGRA) 100 MG tablet Take 1 tablet (100 mg total) by mouth as needed for erectile dysfunction. 10 tablet 3   testosterone cypionate (DEPOTESTOSTERONE CYPIONATE) 200 MG/ML injection Inject 0.5 mLs (100 mg total) into the muscle 2 (two) times a week. 10 mL 3   No current facility-administered medications on file prior to visit.    Observations/Objective: Alert and oriented x 3. Not in acute distress. Physical examination not completed as this is a telemedicine visit.  Assessment and Plan: 1. Encounter to  establish care: - Patient presents today to establish care.  - Return for annual physical examination, labs, and health maintenance. Arrive fasting meaning having no food for at least 8 hours prior to appointment. You may have only water or black coffee. Please take scheduled medications as normal.  2. Type 2 diabetes mellitus without complication, without long-term current use of insulin (HCC): - Hemoglobin A1c 8.7% on 06/15/2021.  - Continue Metformin and Dulaglutide as prescribed.  - Discussed the importance of healthy eating habits, low-carbohydrate diet, low-sugar diet, regular aerobic exercise (at least 150 minutes a week as tolerated) and medication compliance to achieve or maintain control of diabetes. - Return to office within the next 7 days for updated hemoglobin A1c. - Follow-up with primary provider as scheduled.  - POCT glycosylated hemoglobin (Hb A1C); Future - metFORMIN (GLUCOPHAGE) 500 MG tablet; Take 2 tablets (1,000 mg total) by mouth 2 (two) times daily with a meal.  Dispense: 120 tablet; Refill: 0 - Dulaglutide (TRULICITY) 0.75 MG/0.5ML SOPN; Inject 0.75 mg into the skin once a week.  Dispense: 2 mL; Refill: 0 - Insulin Pen Needle (PEN NEEDLES) 31G X 8 MM MISC; UAD  Dispense: 100 each; Refill: 0  3. Essential hypertension: - Continue Amlodipine and Hydrochlorothiazide as prescribed.  - Counseled on blood pressure goal of less than 130/80, low-sodium, DASH diet, medication compliance, 150 minutes of moderate intensity exercise per week as tolerated. Discussed medication compliance, adverse effects. - Follow-up with primary provider as scheduled.  - amLODipine (NORVASC) 5 MG tablet; Take 1 tablet (5 mg total) by mouth daily.  Dispense: 30 tablet; Refill: 0 - hydrochlorothiazide (HYDRODIURIL) 25 MG tablet; Take 1 tablet (25 mg total) by mouth daily.  Dispense: 30 tablet; Refill: 0   Follow Up Instructions: Return for annual physical exam.   Patient was given clear  instructions to go to Emergency Department or return to medical center if symptoms don't improve, worsen, or new problems develop.The patient verbalized understanding.  I discussed the assessment and treatment plan with the patient. The patient was provided an opportunity to ask questions and all were answered. The patient agreed with the plan and demonstrated an understanding of the instructions.   The patient was advised to call back or seek an in-person evaluation if the symptoms worsen or if the condition fails to improve as anticipated.    I provided 12 minutes total of non-face-to-face time during this encounter.   Rema Fendt, NP  [facility] [place], [place] [phone] 10/29/2021, 3:51 PM

## 2021-10-29 ENCOUNTER — Encounter: Payer: Self-pay | Admitting: Family

## 2021-10-29 ENCOUNTER — Ambulatory Visit (INDEPENDENT_AMBULATORY_CARE_PROVIDER_SITE_OTHER): Payer: BC Managed Care – PPO | Admitting: Family

## 2021-10-29 ENCOUNTER — Other Ambulatory Visit: Payer: Self-pay

## 2021-10-29 DIAGNOSIS — E119 Type 2 diabetes mellitus without complications: Secondary | ICD-10-CM | POA: Diagnosis not present

## 2021-10-29 DIAGNOSIS — Z7689 Persons encountering health services in other specified circumstances: Secondary | ICD-10-CM

## 2021-10-29 DIAGNOSIS — I10 Essential (primary) hypertension: Secondary | ICD-10-CM

## 2021-10-29 DIAGNOSIS — E039 Hypothyroidism, unspecified: Secondary | ICD-10-CM

## 2021-10-29 MED ORDER — TRULICITY 0.75 MG/0.5ML ~~LOC~~ SOAJ: 0.7500 mg | SUBCUTANEOUS | 0 refills | Status: DC

## 2021-10-29 MED ORDER — PEN NEEDLES 31G X 8 MM MISC
0 refills | Status: DC
Start: 2021-10-29 — End: 2022-02-03

## 2021-10-29 MED ORDER — METFORMIN HCL 500 MG PO TABS: 1000.0000 mg | ORAL_TABLET | Freq: Two times a day (BID) | ORAL | 0 refills | Status: DC

## 2021-10-29 MED ORDER — AMLODIPINE BESYLATE 5 MG PO TABS: 5.0000 mg | ORAL_TABLET | Freq: Every day | ORAL | 0 refills | Status: DC

## 2021-10-29 MED ORDER — HYDROCHLOROTHIAZIDE 25 MG PO TABS: 25.0000 mg | ORAL_TABLET | Freq: Every day | ORAL | 0 refills | Status: DC

## 2021-10-29 NOTE — Progress Notes (Signed)
Patient has no new concerns that he need to talk with provider about today.

## 2021-11-12 ENCOUNTER — Other Ambulatory Visit: Payer: Self-pay | Admitting: Family

## 2021-11-12 DIAGNOSIS — I10 Essential (primary) hypertension: Secondary | ICD-10-CM

## 2021-12-17 ENCOUNTER — Other Ambulatory Visit: Payer: Self-pay | Admitting: Family

## 2021-12-17 DIAGNOSIS — I10 Essential (primary) hypertension: Secondary | ICD-10-CM

## 2021-12-28 ENCOUNTER — Other Ambulatory Visit: Payer: Self-pay | Admitting: Family

## 2021-12-28 DIAGNOSIS — I10 Essential (primary) hypertension: Secondary | ICD-10-CM

## 2022-01-14 ENCOUNTER — Other Ambulatory Visit: Payer: Self-pay | Admitting: Family

## 2022-01-14 ENCOUNTER — Other Ambulatory Visit: Payer: Self-pay

## 2022-01-14 DIAGNOSIS — I10 Essential (primary) hypertension: Secondary | ICD-10-CM

## 2022-01-14 MED ORDER — HYDROCHLOROTHIAZIDE 25 MG PO TABS: 25.0000 mg | ORAL_TABLET | Freq: Every day | ORAL | 0 refills | Status: DC

## 2022-01-21 ENCOUNTER — Other Ambulatory Visit: Payer: Self-pay | Admitting: Family

## 2022-01-21 ENCOUNTER — Other Ambulatory Visit: Payer: Self-pay

## 2022-01-21 DIAGNOSIS — E119 Type 2 diabetes mellitus without complications: Secondary | ICD-10-CM

## 2022-01-21 MED ORDER — METFORMIN HCL 500 MG PO TABS: 1000.0000 mg | ORAL_TABLET | Freq: Two times a day (BID) | ORAL | 0 refills | Status: DC

## 2022-01-31 NOTE — Progress Notes (Signed)
Patient ID: EGE MUCKEY, male    DOB: 1971-05-29  MRN: [id number]  CC: Diabetes Follow-Up   Subjective: Vincent Ruiz is a 51 y.o. male who presents for diabetes follow-up.   His concerns today include:  DIABETES TYPE 2 FOLLOW-UP: 10/29/2021: - Hemoglobin A1c 8.7% on 06/15/2021.  - Continue Metformin and Dulaglutide as prescribed. - Return to office within the next 7 days for updated hemoglobin A1c.  02/03/2022: Doing well on current regimen. Questioned about comparison of current Trulicity versus changing to [employer]. Not checking blood sugars at home routinely.  2. HYPERTENSION FOLLOW-UP: 10/29/2021: - Continue Amlodipine and Hydrochlorothiazide as prescribed.   02/03/2022: Doing well on current regimen. No side effects. No issues/concerns. Denies chest pain and shortness of breath. Not checking blood pressures at home.   3. HYPOTHYROIDISM: Requesting thyroid medication refills. Hypothyroidism for years.   4. SKIN CONCERN: Bump appeared 2 days ago. Shortly after came to a head with pus and blood drainage. Denies pain. Endorses soreness.   Patient Active Problem List   Diagnosis Date Noted   Bipolar 2 disorder, major depressive episode ([place]) 07/03/2016   Intentional benzodiazepine overdose ([place]) 07/02/2016   Hypertension 07/02/2016   Morbid obesity ([place]) 07/02/2016   Type 2 diabetes mellitus ([place]) 07/02/2016   Hypothyroidism 07/02/2016   Sleep apnea on CPAP 07/02/2016     Current Outpatient Medications on File Prior to Visit  Medication Sig Dispense Refill   Cholecalciferol (VITAMIN D3) 10000 units capsule Take 10,000 Units by mouth daily.     NP THYROID 60 MG tablet Take 1 tablet (60 mg total) by mouth daily before breakfast. 30 tablet 0   sildenafil (VIAGRA) 100 MG tablet Take 1 tablet (100 mg total) by mouth as needed for erectile dysfunction. 10 tablet 3   testosterone cypionate (DEPOTESTOSTERONE CYPIONATE) 200 MG/ML injection Inject 0.5 mLs (100 mg total)  into the muscle 2 (two) times a week. 10 mL 3   No current facility-administered medications on file prior to visit.    Allergies  Allergen Reactions   Penicillins Anaphylaxis    Has patient had a PCN reaction causing immediate rash, facial/tongue/throat swelling, SOB or lightheadedness with hypotension: Yes Has patient had a PCN reaction causing severe rash involving mucus membranes or skin necrosis: Yes Has patient had a PCN reaction that required hospitalization Yes Has patient had a PCN reaction occurring within the last 10 years: No If all of the above answers are "NO", then may proceed with Cephalosporin use.     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   Social Determinants of Health   Financial Resource Strain: Not on file  Food Insecurity: Not on file  Transportation Needs: Not on file  Physical Activity: Not on file  Stress: Not on file  Social Connections: Not on file  Intimate Partner Violence: Not on  file    Family History  Problem Relation Age of Onset   Diabetes Mother    Hypertension Mother    Multiple myeloma Sister     Past Surgical History:  Procedure Laterality Date   KNEE SURGERY Left 2005    ROS: Review of Systems Negative except as stated above  PHYSICAL EXAM: BP 133/78 (BP Location: Left Arm, Patient Position: Sitting, Cuff Size: Large)    Pulse 84    Temp 98.6 F (37 C)    Resp 18    Ht _0  (1.778  m)    Wt (!) 390 lb (176.9 kg)    SpO2 96%    BMI 55.96 kg/m   Physical Exam HENT:     Head: Normocephalic and atraumatic.  Eyes:     Extraocular Movements: Extraocular movements intact.     Conjunctiva/sclera: Conjunctivae normal.     Pupils: Pupils are equal, round, and reactive to light.  Cardiovascular:     Rate and Rhythm: Normal rate and regular rhythm.     Pulses: Normal pulses.     Heart sounds: Normal heart sounds.  Pulmonary:     Effort: Pulmonary effort is normal.     Breath sounds: Normal breath sounds.  Musculoskeletal:     Cervical back: Normal range of motion and neck supple.  Skin:    Findings: Rash present.     Comments: Left lower abdominal erythematous nodule with minimal serosanguineous drainage. No pain with palpation.   Neurological:     General: No focal deficit present.     Mental Status: He is alert and oriented to person, place, and time.  Psychiatric:        Mood and Affect: Mood normal.        Behavior: Behavior normal.   Results for orders placed or performed in visit on 02/03/22  POCT glycosylated hemoglobin (Hb A1C)  Result Value Ref Range   Hemoglobin A1C 8.4 (A) 4.0 - 5.6 %   HbA1c POC (<> result, manual entry)     HbA1c, POC (prediabetic range)     HbA1c, POC (controlled diabetic range)      ASSESSMENT AND PLAN: 1. Type 2 diabetes mellitus without complication, without long-term current use of insulin ([place]): - Hemoglobin A1c not at goal today at 8.4%, goal < 7%. - Continue Metformin as prescribed. - Increase Dulaglutide from 0.75 mg weekly to 1.5 mg weekly.  - Discussed the importance of healthy eating habits, low-carbohydrate diet, low-sugar diet, regular aerobic exercise (at least 150 minutes a week as tolerated) and medication compliance to achieve or maintain control of diabetes. - Follow-up with primary provider in 4 weeks or sooner if needed. - POCT glycosylated hemoglobin (Hb A1C) - metFORMIN (GLUCOPHAGE) 500 MG tablet; Take 2  tablets (1,000 mg total) by mouth 2 (two) times daily with a meal.  Dispense: 360 tablet; Refill: 0 - Dulaglutide (TRULICITY) 1.5 AS/3.4HD SOPN; Inject 1.5 mg into the skin once a week.  Dispense: 2 mL; Refill: 0 - Insulin Pen Needle (PEN NEEDLES) 31G X 8 MM MISC; UAD  Dispense: 100 each; Refill: 0  2. Essential (primary) hypertension: - Continue Amlodipine and Hydrochlorothiazide as prescribed.  - Counseled on blood pressure goal of less than 130/80, low-sodium, DASH diet, medication compliance, 150 minutes of moderate intensity exercise per week as tolerated. Discussed medication compliance, adverse effects. - BMP to evaluate kidney function and electrolyte balance. - Follow-up with primary provider in 3 months or sooner if needed.  - Basic Metabolic  Panel - amLODipine (NORVASC) 5 MG tablet; Take 1 tablet (5 mg total) by mouth daily.  Dispense: 90 tablet; Refill: 0 - hydrochlorothiazide (HYDRODIURIL) 25 MG tablet; Take 1 tablet (25 mg total) by mouth daily.  Dispense: 90 tablet; Refill: 0  3. Hypothyroidism, unspecified type: - Will update TSH today. Medication refill pending lab result. - TSH  4. Rash and nonspecific skin eruption: - Mupirocin ointment as prescribed. - Follow-up with primary provider as scheduled. - mupirocin ointment (BACTROBAN) 2 %; Apply 1 application topically 2 (two) times daily.  Dispense: 60 g; Refill: 0    Patient was given the opportunity to ask questions.  Patient verbalized understanding of the plan and was able to repeat key elements of the plan. Patient was given clear instructions to go to Emergency Department or return to medical center if symptoms don't improve, worsen, or new problems develop.The patient verbalized understanding.   Orders Placed This Encounter  Procedures   Basic Metabolic Panel   TSH   POCT glycosylated hemoglobin (Hb A1C)     Requested Prescriptions   Signed Prescriptions Disp Refills   amLODipine (NORVASC) 5 MG tablet 90  tablet 0    Sig: Take 1 tablet (5 mg total) by mouth daily.   metFORMIN (GLUCOPHAGE) 500 MG tablet 360 tablet 0    Sig: Take 2 tablets (1,000 mg total) by mouth 2 (two) times daily with a meal.   Dulaglutide (TRULICITY) 1.5 OR/3.0YL SOPN 2 mL 0    Sig: Inject 1.5 mg into the skin once a week.   Insulin Pen Needle (PEN NEEDLES) 31G X 8 MM MISC 100 each 0    Sig: UAD   hydrochlorothiazide (HYDRODIURIL) 25 MG tablet 90 tablet 0    Sig: Take 1 tablet (25 mg total) by mouth daily.   mupirocin ointment (BACTROBAN) 2 % 60 g 0    Sig: Apply 1 application topically 2 (two) times daily.    Return in about 3 months (around 05/03/2022) for Follow-Up or next available hypertension, 4 weeks diabetes .  Camillia Herter, NP

## 2022-02-01 DIAGNOSIS — Z03818 Encounter for observation for suspected exposure to other biological agents ruled out: Secondary | ICD-10-CM | POA: Diagnosis not present

## 2022-02-01 DIAGNOSIS — Z20822 Contact with and (suspected) exposure to covid-19: Secondary | ICD-10-CM | POA: Diagnosis not present

## 2022-02-03 ENCOUNTER — Encounter: Payer: Self-pay | Admitting: Family

## 2022-02-03 ENCOUNTER — Other Ambulatory Visit: Payer: Self-pay

## 2022-02-03 ENCOUNTER — Ambulatory Visit (INDEPENDENT_AMBULATORY_CARE_PROVIDER_SITE_OTHER): Payer: BC Managed Care – PPO | Admitting: Family

## 2022-02-03 VITALS — BP 133/78 | HR 84 | Temp 98.6°F | Resp 18 | Ht 70.0 in | Wt 390.0 lb

## 2022-02-03 DIAGNOSIS — E119 Type 2 diabetes mellitus without complications: Secondary | ICD-10-CM

## 2022-02-03 DIAGNOSIS — E039 Hypothyroidism, unspecified: Secondary | ICD-10-CM

## 2022-02-03 DIAGNOSIS — R21 Rash and other nonspecific skin eruption: Secondary | ICD-10-CM

## 2022-02-03 DIAGNOSIS — I10 Essential (primary) hypertension: Secondary | ICD-10-CM

## 2022-02-03 LAB — POCT GLYCOSYLATED HEMOGLOBIN (HGB A1C): Hemoglobin A1C: 8.4 % — AB (ref 4.0–5.6)

## 2022-02-03 MED ORDER — HYDROCHLOROTHIAZIDE 25 MG PO TABS: 25.0000 mg | ORAL_TABLET | Freq: Every day | ORAL | 0 refills | Status: DC

## 2022-02-03 MED ORDER — PEN NEEDLES 31G X 8 MM MISC
0 refills | Status: DC
Start: 2022-02-03 — End: 2022-03-03

## 2022-02-03 MED ORDER — AMLODIPINE BESYLATE 5 MG PO TABS: 5.0000 mg | ORAL_TABLET | Freq: Every day | ORAL | 0 refills | Status: DC

## 2022-02-03 MED ORDER — METFORMIN HCL 500 MG PO TABS: 1000.0000 mg | ORAL_TABLET | Freq: Two times a day (BID) | ORAL | 0 refills | Status: DC

## 2022-02-03 MED ORDER — MUPIROCIN 2 % EX OINT: 1.0000 "application " | TOPICAL_OINTMENT | Freq: Two times a day (BID) | CUTANEOUS | 0 refills | Status: DC

## 2022-02-03 MED ORDER — TRULICITY 1.5 MG/0.5ML ~~LOC~~ SOAJ: 1.5000 mg | SUBCUTANEOUS | 0 refills | Status: DC

## 2022-02-03 NOTE — Patient Instructions (Signed)
Metformin Tablets What is this medication? METFORMIN (met FOR min) treats type 2 diabetes. It controls blood sugar (glucose) and helps your body use insulin effectively. This medication is often combined with changes to diet and exercise. This medicine may be used for other purposes; ask your health care provider or pharmacist if you have questions. COMMON BRAND NAME(S): Glucophage What should I tell my care team before I take this medication? They need to know if you have any of these conditions: Anemia Dehydration Heart disease If you often drink alcohol Kidney disease Liver disease Polycystic ovary syndrome Serious infection or injury Vomiting An unusual or allergic reaction to metformin, other medications, foods, dyes, or preservatives Pregnant or trying to get pregnant Breast-feeding How should I use this medication? Take this medication by mouth with a glass of water. Follow the directions on the prescription label. Take this medication with food. Take your medication at regular intervals. Do not take your medication more often than directed. Do not stop taking except on your care team's advice. Talk to your care team about the use of this medication in children. While this medication may be prescribed for children as young as 48 years of age for selected conditions, precautions do apply. Overdosage: If you think you have taken too much of this medicine contact a poison control center or emergency room at once. NOTE: This medicine is only for you. Do not share this medicine with others. What if I miss a dose? If you miss a dose, take it as soon as you can. If it is almost time for your next dose, take only that dose. Do not take double or extra doses. What may interact with this medication? Do not take this medication with any of the following: Certain contrast medications given before X-rays, CT scans, MRI, or other procedures Dofetilide This medication may also interact with the  following: Acetazolamide Alcohol Certain antivirals for HIV or hepatitis Certain medications for blood pressure, heart disease, irregular heart beat Cimetidine Dichlorphenamide Digoxin Diuretics Estrogens, progestins, or birth control pills Glycopyrrolate Isoniazid Lamotrigine Memantine Methazolamide Metoclopramide Midodrine Niacin Phenothiazines like chlorpromazine, mesoridazine, prochlorperazine, thioridazine Phenytoin Ranolazine Steroid medications like prednisone or cortisone Stimulant medications for attention disorders, weight loss, or to stay awake Thyroid medications Topiramate Trospium Vandetanib Zonisamide This list may not describe all possible interactions. Give your health care provider a list of all the medicines, herbs, non-prescription drugs, or dietary supplements you use. Also tell them if you smoke, drink alcohol, or use illegal drugs. Some items may interact with your medicine. What should I watch for while using this medication? Visit your care team for regular checks on your progress. A test called the HbA1C (A1C) will be monitored. This is a simple blood test. It measures your blood sugar control over the last 2 to 3 months. You will receive this test every 3 to 6 months. Using this medication with insulin or a sulfonylurea may increase your risk of hypoglycemia. Learn how to check your blood sugar. Learn the symptoms of low and high blood sugar and how to manage them. Always carry a quick-source of sugar with you in case you have symptoms of low blood sugar. Examples include hard sugar candy or glucose tablets. Make sure others know that you can choke if you eat or drink when you develop serious symptoms of low blood sugar, such as seizures or unconsciousness. They must get medical help at once. Tell your care team if you have high blood sugar. You might need  to change the dose of your medication. If you are sick or exercising more than usual, you might need  to change the dose of your medication. Do not skip meals. Ask your care team if you should avoid alcohol. Many nonprescription cough and cold products contain sugar or alcohol. These can affect blood sugar. This medication may cause ovulation in premenopausal women who do not have regular monthly periods. This may increase your chances of becoming pregnant. You should not take this medication if you become pregnant or think you may be pregnant. Talk with your care team about your birth control options while taking this medication. Contact your care team right away if you think you are pregnant. If you are going to need surgery, an MRI, CT scan, or other procedure, tell your care team that you are taking this medication. You may need to stop taking this medication before the procedure. Wear a medical ID bracelet or chain, and carry a card that describes your disease and details of your medication and dosage times. This medication may cause a decrease in folic acid and vitamin B12. You should make sure that you get enough vitamins while you are taking this medication. Discuss the foods you eat and the vitamins you take with your care team. What side effects may I notice from receiving this medication? Side effects that you should report to your care team as soon as possible: Allergic reactions--skin rash, itching, hives, swelling of the face, lips, tongue, or throat High lactic acid level--muscle pain or cramps, stomach pain, trouble breathing, general discomfort or fatigue Low vitamin B12 level--pain, tingling, or numbness in the hands or feet, muscle weakness, dizziness, confusion, difficulty concentrating Side effects that usually do not require medical attention (report to your care team if they continue or are bothersome): Diarrhea Gas Headache Metallic taste in mouth Nausea This list may not describe all possible side effects. Call your doctor for medical advice about side effects. You may  report side effects to FDA at 1-800-FDA-1088. Where should I keep my medication? Keep out of the reach of children and pets. Store at room temperature between 15 and 30 degrees C (59 and 86 degrees F). Protect from moisture and light. Get rid of any unused medication after the expiration date. To get rid of medications that are no longer needed or expired: Take the medication to a medication take-back program. Check with your pharmacy or law enforcement to find a location. If you cannot return the medication, check the label or package insert to see if the medication should be thrown out in the garbage or flushed down the toilet. If you are not sure, ask your care team. If it is safe to put in the trash, empty the medication out of the container. Mix the medication with cat litter, dirt, coffee grounds, or other unwanted substance. Seal the mixture in a bag or container. Put it in the trash. NOTE: This sheet is a summary. It may not cover all possible information. If you have questions about this medicine, talk to your doctor, pharmacist, or health care provider.  2022 Elsevier/Gold Standard (2020-11-20 00:00:00)

## 2022-02-03 NOTE — Progress Notes (Signed)
Diabetes discussed in office.

## 2022-02-03 NOTE — Progress Notes (Signed)
Pt presents for diabetes and hypertension follow-up A1c 8.7% on 06/14/22 down to 8.4%  Pt has knot on left side of stomach that would like the provider to look at  Needs refills on Amlodipine and NP thyroid

## 2022-02-04 ENCOUNTER — Other Ambulatory Visit: Payer: Self-pay | Admitting: Family

## 2022-02-04 DIAGNOSIS — E039 Hypothyroidism, unspecified: Secondary | ICD-10-CM

## 2022-02-04 LAB — BASIC METABOLIC PANEL
BUN/Creatinine Ratio: 13 (ref 9–20)
BUN: 17 mg/dL (ref 6–24)
CO2: 25 mmol/L (ref 20–29)
Calcium: 10.1 mg/dL (ref 8.7–10.2)
Chloride: 99 mmol/L (ref 96–106)
Creatinine, Ser: 1.34 mg/dL — ABNORMAL HIGH (ref 0.76–1.27)
Glucose: 116 mg/dL — ABNORMAL HIGH (ref 70–99)
Potassium: 4.7 mmol/L (ref 3.5–5.2)
Sodium: 139 mmol/L (ref 134–144)
eGFR: 65 mL/min/{1.73_m2} (ref >59)

## 2022-02-04 LAB — TSH: TSH: 4.58 u[IU]/mL — ABNORMAL HIGH (ref 0.450–4.500)

## 2022-02-04 MED ORDER — NP THYROID 60 MG PO TABS: 60.0000 mg | ORAL_TABLET | Freq: Every day | ORAL | 1 refills | Status: DC

## 2022-02-04 NOTE — Progress Notes (Signed)
Kidney function normal.   Continue NP thyroid as prescribed. Recheck thyroid function in 6 months.

## 2022-02-07 ENCOUNTER — Other Ambulatory Visit: Payer: Self-pay | Admitting: Family

## 2022-02-07 DIAGNOSIS — E039 Hypothyroidism, unspecified: Secondary | ICD-10-CM

## 2022-02-09 ENCOUNTER — Other Ambulatory Visit: Payer: Self-pay | Admitting: Family

## 2022-02-09 DIAGNOSIS — E039 Hypothyroidism, unspecified: Secondary | ICD-10-CM

## 2022-02-09 MED ORDER — THYROID 60 MG PO TABS: 60.0000 mg | ORAL_TABLET | Freq: Every day | ORAL | 1 refills | Status: DC

## 2022-02-09 NOTE — Telephone Encounter (Signed)
[place] Thyroid 60 mg tablets ordered as replacement of NP Thyroid 60 mg tablets per pharmacy request related to backorder.

## 2022-02-10 ENCOUNTER — Other Ambulatory Visit: Payer: Self-pay | Admitting: Family

## 2022-02-10 DIAGNOSIS — E039 Hypothyroidism, unspecified: Secondary | ICD-10-CM

## 2022-02-10 MED ORDER — LEVOTHYROXINE SODIUM 50 MCG PO TABS: 50.0000 ug | ORAL_TABLET | Freq: Every day | ORAL | 0 refills | Status: DC

## 2022-02-10 NOTE — Telephone Encounter (Signed)
Please call patient with update.   Levothyroxine 50 mcg daily prescribed as replacement of NP Thyroid 60 mg due to [employer].  Note there is no Levothyroxine 60 mg available.   Patient encouraged to have thyroid levels rechecked in 4 to 6 weeks as result of new medication change.

## 2022-02-27 NOTE — Progress Notes (Unsigned)
Patient ID: Vincent Ruiz, male    DOB: 1971/03/26  MRN: [id number]  CC: Diabetes Follow-Up  Subjective: Vincent Ruiz is a 51 y.o. male who presents for diabetes follow-up.   His concerns today include:  DIABETES TYPE 2 FOLLOW-UP: 02/03/2022: - Continue Metformin as prescribed. - Increase Dulaglutide from 0.75 mg weekly to 1.5 mg weekly.   03/04/2022: Recheck A1c 8 weeks   Patient Active Problem List   Diagnosis Date Noted   Bipolar 2 disorder, major depressive episode ([place]) 07/03/2016   Intentional benzodiazepine overdose ([place]) 07/02/2016   Hypertension 07/02/2016   Morbid obesity ([place]) 07/02/2016   Type 2 diabetes mellitus ([place]) 07/02/2016   Hypothyroidism 07/02/2016   Sleep apnea on CPAP 07/02/2016     Current Outpatient Medications on File Prior to Visit  Medication Sig Dispense Refill   amLODipine (NORVASC) 5 MG tablet Take 1 tablet (5 mg total) by mouth daily. 90 tablet 0   Cholecalciferol (VITAMIN D3) 10000 units capsule Take 10,000 Units by mouth daily.     Dulaglutide (TRULICITY) 1.5 WP/8.0DX SOPN Inject 1.5 mg into the skin once a week. 2 mL 0   hydrochlorothiazide (HYDRODIURIL) 25 MG tablet Take 1 tablet (25 mg total) by mouth daily. 90 tablet 0   Insulin Pen Needle (PEN NEEDLES) 31G X 8 MM MISC UAD 100 each 0   levothyroxine (SYNTHROID) 50 MCG tablet Take 1 tablet (50 mcg total) by mouth daily. 60 tablet 0   metFORMIN (GLUCOPHAGE) 500 MG tablet Take 2 tablets (1,000 mg total) by mouth 2 (two) times daily with a meal. 360 tablet 0   mupirocin ointment (BACTROBAN) 2 % Apply 1 application topically 2 (two) times daily. 60 g 0   sildenafil (VIAGRA) 100 MG tablet Take 1 tablet (100 mg total) by mouth as needed for erectile dysfunction. 10 tablet 3   testosterone cypionate (DEPOTESTOSTERONE CYPIONATE) 200 MG/ML injection Inject 0.5 mLs (100 mg total) into the muscle 2 (two) times a week. 10 mL 3   thyroid (ARMOUR THYROID) 60 MG tablet Take 1 tablet (60 mg total)  by mouth daily before breakfast. 90 tablet 1   No current facility-administered medications on file prior to visit.    Allergies  Allergen Reactions   Penicillins Anaphylaxis    Has patient had a PCN reaction causing immediate rash, facial/tongue/throat swelling, SOB or lightheadedness with hypotension: Yes Has patient had a PCN reaction causing severe rash involving mucus membranes or skin necrosis: Yes Has patient had a PCN reaction that required hospitalization Yes Has patient had a PCN reaction occurring within the last 10 years: No If all of the above answers are "NO", then may proceed with Cephalosporin use.     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   Social Determinants of Health   Financial Resource Strain: Not on file  Food  Insecurity: Not on file  Transportation Needs: Not on file  Physical Activity: Not on file  Stress: Not on file  Social Connections: Not on file  Intimate Partner Violence: Not on file    Family History  Problem Relation Age of Onset   Diabetes Mother    Hypertension Mother    Multiple myeloma Sister     Past Surgical History:  Procedure Laterality Date   KNEE SURGERY Left 2005    ROS: Review of Systems Negative except as stated above  PHYSICAL EXAM: There were no vitals taken for this visit.  Physical Exam  {male adult master:310786} {male adult master:310785}  CMP Latest Ref Rng &  Units 02/03/2022 06/14/2021 03/08/2021  Glucose 70 - 99 mg/dL 116(H) 118(H) 327(H)  BUN 6 - 24 mg/dL '17 16 16  ' Creatinine 0.76 - 1.27 mg/dL 1.34(H) 1.10 1.06  Sodium 134 - 144 mmol/L 139 136 136  Potassium 3.5 - 5.2 mmol/L 4.7 4.0 4.6  Chloride 96 - 106 mmol/L 99 101 96(L)  CO2 20 - 29 mmol/L '25 26 27  ' Calcium 8.7 - 10.2 mg/dL 10.1 9.4 9.5  Total Protein 6.1 - 8.1 g/dL - - 7.5  Total Bilirubin 0.2 - 1.2 mg/dL - - 1.1  Alkaline Phos 38 - 126 U/L - - -  AST 10 - 40 U/L - - 21  ALT 9 - 46 U/L - - 23   Lipid Panel     Component Value Date/Time   CHOL 194 07/13/2020 1451   TRIG 187 (H) 07/13/2020 1451   HDL 43 07/13/2020 1451   CHOLHDL 4.5 07/13/2020 1451   LDLCALC 121 (H) 07/13/2020 1451    CBC    Component Value Date/Time   WBC 9.5 07/13/2020 1451   RBC 4.82 07/13/2020 1451   HGB 15.0 07/13/2020 1451   HCT 44.6 07/13/2020 1451   PLT 250 07/13/2020 1451   MCV 92.5 07/13/2020 1451   MCH 31.1 07/13/2020 1451   MCHC 33.6 07/13/2020 1451   RDW 12.1 07/13/2020 1451   LYMPHSABS 1.5 02/16/2017 1932   MONOABS 0.9 02/16/2017 1932   EOSABS 0.2 02/16/2017 1932   BASOSABS 0.0 02/16/2017 1932    ASSESSMENT AND PLAN:  There are no diagnoses linked to this encounter.   Patient was given the opportunity to ask questions.  Patient verbalized understanding of the plan and was able to repeat key elements of the plan. Patient was given clear instructions to go to Emergency Department or return to medical center if symptoms don't improve, worsen, or new problems develop.The patient verbalized understanding.   No orders of the defined types were placed in this encounter.    Requested Prescriptions    No prescriptions requested or ordered in this encounter    No follow-ups on file.  Camillia Herter, NP

## 2022-03-03 ENCOUNTER — Encounter: Payer: Self-pay | Admitting: Family

## 2022-03-03 ENCOUNTER — Other Ambulatory Visit: Payer: Self-pay

## 2022-03-03 ENCOUNTER — Ambulatory Visit (INDEPENDENT_AMBULATORY_CARE_PROVIDER_SITE_OTHER): Payer: BC Managed Care – PPO | Admitting: Family

## 2022-03-03 ENCOUNTER — Other Ambulatory Visit: Payer: Self-pay | Admitting: Family

## 2022-03-03 VITALS — BP 129/72 | HR 83 | Temp 98.5°F | Resp 18 | Ht 70.0 in | Wt 394.0 lb

## 2022-03-03 DIAGNOSIS — E119 Type 2 diabetes mellitus without complications: Secondary | ICD-10-CM

## 2022-03-03 MED ORDER — TRULICITY 1.5 MG/0.5ML ~~LOC~~ SOAJ: 1.5000 mg | SUBCUTANEOUS | 0 refills | Status: DC

## 2022-03-03 NOTE — Progress Notes (Signed)
Pt presents for diabetes follow-up  

## 2022-04-27 NOTE — Progress Notes (Deleted)
Patient ID: Vincent Ruiz, male    DOB: 1971-10-12  MRN: [id number]  CC: Chronic Conditions Follow-Up  Subjective: Vincent Ruiz is a 51 y.o. male who presents for chronic conditions follow-up.   His concerns today include:  Diabetes type 2 follow-up: 03/03/2022: - Continue Metformin as prescribed. - Continue Dulaglutide as prescribed.   05/03/2022:  2. Hypertension follow-up: 02/03/2022: - Continue Amlodipine and Hydrochlorothiazide as prescribed.   05/03/2022:   Patient Active Problem List   Diagnosis Date Noted   Bipolar 2 disorder, major depressive episode ([place]) 07/03/2016   Intentional benzodiazepine overdose ([place]) 07/02/2016   Hypertension 07/02/2016   Morbid obesity ([place]) 07/02/2016   Type 2 diabetes mellitus ([place]) 07/02/2016   Hypothyroidism 07/02/2016   Sleep apnea on CPAP 07/02/2016     Current Outpatient Medications on File Prior to Visit  Medication Sig Dispense Refill   amLODipine (NORVASC) 5 MG tablet Take 1 tablet (5 mg total) by mouth daily. 90 tablet 0   Cholecalciferol (VITAMIN D3) 10000 units capsule Take 10,000 Units by mouth daily.     Dulaglutide (TRULICITY) 1.5 TO/6.7TI SOPN Inject 1.5 mg into the skin once a week. 4 mL 0   hydrochlorothiazide (HYDRODIURIL) 25 MG tablet Take 1 tablet (25 mg total) by mouth daily. 90 tablet 0   metFORMIN (GLUCOPHAGE) 500 MG tablet Take 2 tablets (1,000 mg total) by mouth 2 (two) times daily with a meal. 360 tablet 0   mupirocin ointment (BACTROBAN) 2 % Apply 1 application topically 2 (two) times daily. 60 g 0   sildenafil (VIAGRA) 100 MG tablet Take 1 tablet (100 mg total) by mouth as needed for erectile dysfunction. 10 tablet 3   testosterone cypionate (DEPOTESTOSTERONE CYPIONATE) 200 MG/ML injection Inject 0.5 mLs (100 mg total) into the muscle 2 (two) times a week. 10 mL 3   thyroid (ARMOUR THYROID) 60 MG tablet Take 1 tablet (60 mg total) by mouth daily before breakfast. 90 tablet 1   No current  facility-administered medications on file prior to visit.    Allergies  Allergen Reactions   Penicillins Anaphylaxis    Has patient had a PCN reaction causing immediate rash, facial/tongue/throat swelling, SOB or lightheadedness with hypotension: Yes Has patient had a PCN reaction causing severe rash involving mucus membranes or skin necrosis: Yes Has patient had a PCN reaction that required hospitalization Yes Has patient had a PCN reaction occurring within the last 10 years: No If all of the above answers are "NO", then may proceed with Cephalosporin use.     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   Social Determinants of Health   Financial Resource Strain: Not on file  Food Insecurity: Not on file  Transportation Needs: Not on file  Physical Activity: Not on file  Stress: Not on file  Social Connections: Not on file  Intimate Partner Violence: Not on file    Family History  Problem Relation Age of Onset   Diabetes Mother    Hypertension Mother    Multiple myeloma Sister     Past Surgical History:  Procedure Laterality Date   KNEE SURGERY Left 2005    ROS: Review of Systems Negative except as stated above  PHYSICAL EXAM: There were no vitals taken for this visit.  Physical Exam  {male adult master:310786} {male adult master:310785}     Latest Ref Rng & Units 02/03/2022   10:55 AM  06/14/2021    9:06 AM 03/08/2021    8:46 AM  CMP  Glucose 70 - 99 mg/dL 116   118   327    BUN 6 - 24 mg/dL '17   16   16    ' Creatinine 0.76 - 1.27 mg/dL 1.34   1.10   1.06    Sodium 134 - 144 mmol/L 139   136   136    Potassium 3.5 - 5.2 mmol/L 4.7   4.0   4.6    Chloride 96 - 106 mmol/L 99   101   96    CO2 20 - 29 mmol/L '25   26   27    ' Calcium 8.7 - 10.2 mg/dL 10.1   9.4   9.5    Total Protein 6.1 - 8.1 g/dL   7.5    Total Bilirubin 0.2 - 1.2 mg/dL   1.1    AST 10 - 40 U/L   21    ALT 9 - 46 U/L   23     Lipid Panel     Component Value Date/Time   CHOL 194 07/13/2020 1451   TRIG 187 (H) 07/13/2020 1451   HDL 43 07/13/2020 1451   CHOLHDL 4.5 07/13/2020 1451   LDLCALC 121 (H) 07/13/2020 1451    CBC    Component Value Date/Time   WBC 9.5 07/13/2020 1451   RBC 4.82 07/13/2020 1451   HGB 15.0 07/13/2020 1451   HCT 44.6 07/13/2020 1451   PLT 250 07/13/2020 1451   MCV 92.5 07/13/2020 1451   MCH 31.1 07/13/2020 1451   MCHC 33.6 07/13/2020 1451   RDW 12.1 07/13/2020 1451   LYMPHSABS 1.5 02/16/2017 1932   MONOABS 0.9 02/16/2017 1932   EOSABS 0.2 02/16/2017 1932   BASOSABS 0.0 02/16/2017 1932    ASSESSMENT AND PLAN:  There are no diagnoses linked to this encounter.   Patient was given the opportunity to ask questions.  Patient verbalized understanding of the plan and was able to repeat key elements of the plan. Patient was given clear instructions to go to Emergency Department or return to medical center if symptoms don't improve, worsen, or new problems develop.The patient verbalized understanding.   No orders of the defined types were placed in this encounter.    Requested Prescriptions    No prescriptions requested or ordered in this encounter    No follow-ups on file.  Camillia Herter, NP

## 2022-05-03 ENCOUNTER — Ambulatory Visit: Payer: BC Managed Care – PPO | Admitting: Family

## 2022-05-03 DIAGNOSIS — E119 Type 2 diabetes mellitus without complications: Secondary | ICD-10-CM

## 2022-05-03 DIAGNOSIS — I10 Essential (primary) hypertension: Secondary | ICD-10-CM

## 2022-05-05 ENCOUNTER — Other Ambulatory Visit: Payer: Self-pay | Admitting: Family

## 2022-05-05 DIAGNOSIS — I10 Essential (primary) hypertension: Secondary | ICD-10-CM

## 2022-05-05 NOTE — Telephone Encounter (Signed)
Requested Prescriptions  Pending Prescriptions Disp Refills  . amLODipine (NORVASC) 5 MG tablet [Pharmacy Med Name: AMLODIPINE BESYLATE 5 MG TAB] 90 tablet 0    Sig: TAKE 1 TABLET (5 MG TOTAL) BY MOUTH DAILY.     Cardiovascular: Calcium Channel Blockers 2 Passed - 05/05/2022  9:15 AM      Passed - Last BP in normal range    BP Readings from Last 1 Encounters:  03/03/22 129/72         Passed - Last Heart Rate in normal range    Pulse Readings from Last 1 Encounters:  03/03/22 83         Passed - Valid encounter within last 6 months    Recent Outpatient Visits          2 months ago Type 2 diabetes mellitus without complication, without long-term current use of insulin [facility])   Primary Care at [facility], Amy J, NP   3 months ago Type 2 diabetes mellitus without complication, without long-term current use of insulin [facility])   Primary Care at [facility], Amy J, NP   6 months ago Encounter to establish care   Primary Care at [facility], Flonnie Hailstone, NP      Future Appointments            In 2 weeks Camillia Herter, NP Primary Care at [facility]

## 2022-05-08 ENCOUNTER — Other Ambulatory Visit: Payer: Self-pay | Admitting: Family

## 2022-05-08 DIAGNOSIS — I10 Essential (primary) hypertension: Secondary | ICD-10-CM

## 2022-05-09 MED ORDER — HYDROCHLOROTHIAZIDE 25 MG PO TABS: 25.0000 mg | ORAL_TABLET | Freq: Every day | ORAL | 0 refills | Status: DC

## 2022-05-12 NOTE — Progress Notes (Signed)
Patient ID: KENSHIN SPLAWN, male    DOB: January 12, 1971  MRN: [id number]  CC: Chronic Conditions Follow-Up  Subjective: Vincent Ruiz is a 51 y.o. male who presents for chronic conditions follow-up.   His concerns today include:  Diabetes type 2 follow-up: 03/03/2022: - Continue Metformin as prescribed. - Continue Dulaglutide as prescribed.   05/20/2022: Doing well on current regimen. No side effects. No issues/concerns.   2. Hypertension follow-up: 02/03/2022: - Continue Amlodipine and Hydrochlorothiazide as prescribed.   05/20/2022: Doing well on current regimen. No side effects. No issues/concerns. Denies chest pain, shortness of breath, worst headache of life and additional red flag symptoms. Not checking blood pressure in the home setting. Has noticed bilateral lower extremity swelling with weight gain throughout the day. Improves with rest. Reports has history of bilateral leg edema. Wearing leg compressions as needed. Denies red flag symptoms such as but not limited to bilateral lower extremity pain/redness/tenderness/warmth, chest pain, shortness of breath.   Patient Active Problem List   Diagnosis Date Noted   Bipolar 2 disorder, major depressive episode ([place]) 07/03/2016   Intentional benzodiazepine overdose ([place]) 07/02/2016   Hypertension 07/02/2016   Morbid obesity ([place]) 07/02/2016   Type 2 diabetes mellitus ([place]) 07/02/2016   Hypothyroidism 07/02/2016   Sleep apnea on CPAP 07/02/2016     Current Outpatient Medications on File Prior to Visit  Medication Sig Dispense Refill   amLODipine (NORVASC) 5 MG tablet TAKE 1 TABLET (5 MG TOTAL) BY MOUTH DAILY. 90 tablet 0   Cholecalciferol (VITAMIN D3) 10000 units capsule Take 10,000 Units by mouth daily.     hydrochlorothiazide (HYDRODIURIL) 25 MG tablet Take 1 tablet (25 mg total) by mouth daily. 90 tablet 0   mupirocin ointment (BACTROBAN) 2 % Apply 1 application topically 2 (two) times daily. 60 g 0   sildenafil (VIAGRA)  100 MG tablet Take 1 tablet (100 mg total) by mouth as needed for erectile dysfunction. 10 tablet 3   testosterone cypionate (DEPOTESTOSTERONE CYPIONATE) 200 MG/ML injection Inject 0.5 mLs (100 mg total) into the muscle 2 (two) times a week. 10 mL 3   thyroid (ARMOUR THYROID) 60 MG tablet Take 1 tablet (60 mg total) by mouth daily before breakfast. 90 tablet 1   No current facility-administered medications on file prior to visit.    Allergies  Allergen Reactions   Penicillins Anaphylaxis    Has patient had a PCN reaction causing immediate rash, facial/tongue/throat swelling, SOB or lightheadedness with hypotension: Yes Has patient had a PCN reaction causing severe rash involving mucus membranes or skin necrosis: Yes Has patient had a PCN reaction that required hospitalization Yes Has patient had a PCN reaction occurring within the last 10 years: No If all of the above answers are "NO", then may proceed with Cephalosporin use.     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   Social Determinants of Health   Financial Resource Strain: Not on file  Food Insecurity: Not on file  Transportation Needs: Not on file  Physical Activity: Not on file  Stress: Not on file  Social Connections: Not on file  Intimate Partner Violence: Not on file    Family History  Problem Relation Age of Onset   Diabetes Mother    Hypertension  Mother    Multiple myeloma Sister     Past Surgical History:  Procedure Laterality Date   KNEE SURGERY Left 2005    ROS: Review of Systems Negative except as stated above  PHYSICAL EXAM: BP (!) 152/83   Pulse 78   Temp 98 F (36.7 C)   Resp 18   Ht 5' 10" (1.778 m)   Wt (!) 387 lb (175.5 kg)   SpO2 96%   BMI 55.53 kg/m   Physical Exam HENT:     Head: Normocephalic and atraumatic.  Eyes:     Extraocular Movements: Extraocular movements intact.     Conjunctiva/sclera: Conjunctivae normal.     Pupils: Pupils are equal, round, and reactive to light.  Cardiovascular:     Rate and Rhythm: Normal rate and regular rhythm.     Pulses: Normal pulses.     Heart sounds: Normal heart sounds.  Pulmonary:     Effort: Pulmonary effort is normal.     Breath sounds: Normal breath sounds.  Musculoskeletal:     Cervical back: Normal range of motion and neck supple.     Right knee: Normal.     Left knee: Normal.     Right lower leg: Normal.     Left lower leg: Normal.     Right ankle: Normal.     Left ankle: Normal.     Right foot: Normal.     Left foot: Normal.  Skin:    General: Skin is warm and dry.  Neurological:     General: No focal deficit present.     Mental Status: He is alert and oriented to person, place, and time.  Psychiatric:        Mood and Affect: Mood normal.        Behavior: Behavior normal.    ASSESSMENT AND PLAN: 1. Type 2 diabetes mellitus without complication, without long-term current use of insulin ([place]): - Continue Metformin and Dulaglutide as prescribed. - Discussed the importance of healthy eating habits, low-carbohydrate diet, low-sugar diet, regular aerobic exercise (at least 150 minutes a week as tolerated) and medication compliance to achieve or maintain control of diabetes. - Will obtain hemoglobin A1c today by blood draw. Results pending.  - Follow-up with primary provider as scheduled.  - Hemoglobin A1c - metFORMIN (GLUCOPHAGE) 500 MG  tablet; Take 2 tablets (1,000 mg total) by mouth 2 (two) times daily with a meal.  Dispense: 120 tablet; Refill: 3 - Dulaglutide (TRULICITY) 1.5 HQ/7.5FF SOPN; Inject 1.5 mg into the skin once a week.  Dispense: 2 mL; Refill: 3  2. Essential (primary) hypertension: - Blood pressure not at goal during today's visit. Patient asymptomatic without chest pressure, chest pain, palpitations, shortness of breath, worst headache of life, and any additional red flag symptoms. - Continue Amlodipine as prescribed. No refills needed as of present.  - Increase Hydrochlorothiazide from 25 mg daily to 50 mg daily. No refills needed as of present. Patient to take two 25 mg tablets to equal new increased dosage.  - Counseled on  blood pressure goal of less than 130/80, low-sodium, DASH diet, medication compliance, and 150 minutes of moderate intensity exercise per week as tolerated. Counseled on medication adherence and adverse effects. - Follow-up with primary provider in 2 weeks or sooner if needed for blood pressure check.     Patient was given the opportunity to ask questions.  Patient verbalized understanding of the plan and was able to repeat key elements of the plan. Patient was given clear instructions to go to Emergency Department or return to medical center if symptoms don't improve, worsen, or new problems develop.The patient verbalized understanding.   Orders Placed This Encounter  Procedures   Hemoglobin A1c     Requested Prescriptions   Signed Prescriptions Disp Refills   metFORMIN (GLUCOPHAGE) 500 MG tablet 120 tablet 3    Sig: Take 2 tablets (1,000 mg total) by mouth 2 (two) times daily with a meal.   Dulaglutide (TRULICITY) 1.5 TG/2.5WL SOPN 2 mL 3    Sig: Inject 1.5 mg into the skin once a week.    Return in about 2 weeks (around 06/03/2022) for Follow-Up or next available HTN .  Camillia Herter, NP

## 2022-05-13 ENCOUNTER — Other Ambulatory Visit: Payer: Self-pay | Admitting: Family

## 2022-05-13 DIAGNOSIS — E119 Type 2 diabetes mellitus without complications: Secondary | ICD-10-CM

## 2022-05-20 ENCOUNTER — Ambulatory Visit (INDEPENDENT_AMBULATORY_CARE_PROVIDER_SITE_OTHER): Payer: BC Managed Care – PPO | Admitting: Family

## 2022-05-20 VITALS — BP 152/83 | HR 78 | Temp 98.0°F | Resp 18 | Ht 70.0 in | Wt 387.0 lb

## 2022-05-20 DIAGNOSIS — I10 Essential (primary) hypertension: Secondary | ICD-10-CM | POA: Diagnosis not present

## 2022-05-20 DIAGNOSIS — E119 Type 2 diabetes mellitus without complications: Secondary | ICD-10-CM

## 2022-05-20 MED ORDER — TRULICITY 1.5 MG/0.5ML ~~LOC~~ SOAJ: 1.5000 mg | SUBCUTANEOUS | 3 refills | Status: AC

## 2022-05-20 MED ORDER — METFORMIN HCL 500 MG PO TABS: 1000.0000 mg | ORAL_TABLET | Freq: Two times a day (BID) | ORAL | 3 refills | Status: DC

## 2022-05-20 NOTE — Progress Notes (Signed)
.  Pt presents for diabetes f/u

## 2022-05-21 LAB — HEMOGLOBIN A1C
Est. average glucose Bld gHb Est-mCnc: 157 mg/dL
Hgb A1c MFr Bld: 7.1 % — ABNORMAL HIGH (ref 4.8–5.6)

## 2022-05-21 NOTE — Progress Notes (Signed)
Diabetes close to goal and improved since 3 months ago. Continue with plan discussed in office. We will recheck in 4 months.

## 2022-05-26 NOTE — Progress Notes (Signed)
  Patient ID: Vincent Ruiz, male    DOB: 01/10/1971  MRN: [id number]  CC: Hypertension Follow-Up  Subjective: Vincent Ruiz is a 50 y.o. male who presents for hypertension follow-up.   His concerns today include:  Hypertension follow-up: 05/20/2022: - Continue Amlodipine as prescribed. No refills needed as of present.  - Increase Hydrochlorothiazide from 25 mg daily to 50 mg daily.   06/03/2022: Reports unaware that he was to take 2 tablets of Hydrochlorothiazide to equal the increased dose. Leg swelling about the same as before. No issues/concerns.   Patient Active Problem List   Diagnosis Date Noted   Bipolar 2 disorder, major depressive episode (HCC) 07/03/2016   Intentional benzodiazepine overdose (HCC) 07/02/2016   Hypertension 07/02/2016   Morbid obesity (HCC) 07/02/2016   Type 2 diabetes mellitus (HCC) 07/02/2016   Hypothyroidism 07/02/2016   Sleep apnea on CPAP 07/02/2016     Current Outpatient Medications on File Prior to Visit  Medication Sig Dispense Refill   amLODipine (NORVASC) 5 MG tablet TAKE 1 TABLET (5 MG TOTAL) BY MOUTH DAILY. 90 tablet 0   Cholecalciferol (VITAMIN D3) 10000 units capsule Take 10,000 Units by mouth daily.     Dulaglutide (TRULICITY) 1.5 MG/0.5ML SOPN Inject 1.5 mg into the skin once a week. 2 mL 3   hydrochlorothiazide (HYDRODIURIL) 25 MG tablet Take 1 tablet (25 mg total) by mouth daily. 90 tablet 0   metFORMIN (GLUCOPHAGE) 500 MG tablet Take 2 tablets (1,000 mg total) by mouth 2 (two) times daily with a meal. 120 tablet 3   mupirocin ointment (BACTROBAN) 2 % Apply 1 application topically 2 (two) times daily. 60 g 0   sildenafil (VIAGRA) 100 MG tablet Take 1 tablet (100 mg total) by mouth as needed for erectile dysfunction. 10 tablet 3   testosterone cypionate (DEPOTESTOSTERONE CYPIONATE) 200 MG/ML injection Inject 0.5 mLs (100 mg total) into the muscle 2 (two) times a week. 10 mL 3   thyroid (ARMOUR THYROID) 60 MG tablet Take 1 tablet  (60 mg total) by mouth daily before breakfast. 90 tablet 1   No current facility-administered medications on file prior to visit.    Allergies  Allergen Reactions   Penicillins Anaphylaxis    Has patient had a PCN reaction causing immediate rash, facial/tongue/throat swelling, SOB or lightheadedness with hypotension: Yes Has patient had a PCN reaction causing severe rash involving mucus membranes or skin necrosis: Yes Has patient had a PCN reaction that required hospitalization Yes Has patient had a PCN reaction occurring within the last 10 years: No If all of the above answers are "NO", then may proceed with Cephalosporin use.     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   Social Determinants of Health   Financial Resource Strain: Not on file  Food Insecurity: Not on   file  Transportation Needs: Not on file  Physical Activity: Not on file  Stress: Not on file  Social Connections: Not on file  Intimate Partner Violence: Not on file    Family History  Problem Relation Age of Onset   Diabetes Mother    Hypertension Mother    Multiple myeloma Sister     Past Surgical History:  Procedure Laterality Date   KNEE SURGERY Left 2005    ROS: Review of Systems Negative except as stated above  PHYSICAL EXAM: BP 129/81 (BP Location: Right Arm, Patient Position: Sitting, Cuff Size: Large)   Pulse 83   Temp 98.3  F (36.8 C)   Resp 16   Ht 5' 10.39" (1.788 m)   Wt (!) 379 lb (171.9 kg)   SpO2 93%   BMI 53.77 kg/m   Physical Exam HENT:     Head: Normocephalic and atraumatic.  Eyes:     Extraocular Movements: Extraocular movements intact.     Conjunctiva/sclera: Conjunctivae normal.     Pupils: Pupils are equal, round, and reactive to light.  Cardiovascular:     Rate and Rhythm: Normal rate and regular rhythm.     Pulses: Normal pulses.     Heart sounds: Normal heart sounds.  Pulmonary:     Effort: Pulmonary effort is normal.     Breath sounds: Normal breath sounds.  Musculoskeletal:     Cervical back: Normal range of motion and neck supple.  Neurological:     General: No focal deficit present.     Mental Status: He is alert and oriented to person, place, and time.  Psychiatric:        Mood and Affect: Mood normal.        Behavior: Behavior normal.     ASSESSMENT AND PLAN: 1. Essential (primary) hypertension - Blood pressures normalized since last appointment. Therefore, will keep him on current doses of Amlodipine and Hydrochlorothiazide. No refills needed as of present. - Follow-up with primary provider in 4 months or sooner if needed.     Patient was given the opportunity to ask questions.  Patient verbalized understanding of the plan and was able to repeat key elements of the plan. Patient was given clear instructions to go to Emergency Department or return to medical center if symptoms don't improve, worsen, or new problems develop.The patient verbalized understanding.   Orders Placed This Encounter  Procedures   Basic Metabolic Panel     Return in about 4 years (around 06/03/2026) for Follow-Up or next available HTN .  Camillia Herter, NP

## 2022-06-03 ENCOUNTER — Ambulatory Visit (INDEPENDENT_AMBULATORY_CARE_PROVIDER_SITE_OTHER): Payer: BC Managed Care – PPO | Admitting: Family

## 2022-06-03 VITALS — BP 129/81 | HR 83 | Temp 98.3°F | Resp 16 | Ht 70.39 in | Wt 379.0 lb

## 2022-06-03 DIAGNOSIS — I10 Essential (primary) hypertension: Secondary | ICD-10-CM

## 2022-06-03 NOTE — Progress Notes (Signed)
.  Pt presents for hypertension f/u , pt has only been taking 25 mg of HCTZ

## 2022-08-07 ENCOUNTER — Other Ambulatory Visit: Payer: Self-pay | Admitting: Family

## 2022-08-07 DIAGNOSIS — I10 Essential (primary) hypertension: Secondary | ICD-10-CM

## 2022-08-18 ENCOUNTER — Other Ambulatory Visit: Payer: Self-pay | Admitting: Family

## 2022-08-18 ENCOUNTER — Encounter: Payer: Self-pay | Admitting: Family

## 2022-08-18 DIAGNOSIS — N529 Male erectile dysfunction, unspecified: Secondary | ICD-10-CM | POA: Insufficient documentation

## 2022-08-18 MED ORDER — SILDENAFIL CITRATE 100 MG PO TABS: ORAL_TABLET | ORAL | 3 refills | Status: DC

## 2022-08-19 ENCOUNTER — Other Ambulatory Visit: Payer: Self-pay | Admitting: Family

## 2022-08-19 DIAGNOSIS — E039 Hypothyroidism, unspecified: Secondary | ICD-10-CM

## 2022-09-25 NOTE — Progress Notes (Deleted)
Patient ID: Vincent Ruiz, male    DOB: Jan 09, 1971  MRN: [id number]  CC: Chronic Care Management  Subjective: Vincent Ruiz is a 51 y.o. male who presents for chronic care management.   His concerns today include:  HTN - Amlodipine, HCTZ DM - Metformin and Dulaglutide  Hypothyroid - [place] thyroid    Patient Active Problem List   Diagnosis Date Noted   Erectile dysfunction 08/18/2022   Bipolar 2 disorder, major depressive episode (HCC) 07/03/2016   Intentional benzodiazepine overdose (HCC) 07/02/2016   Hypertension 07/02/2016   Morbid obesity (HCC) 07/02/2016   Type 2 diabetes mellitus (HCC) 07/02/2016   Hypothyroidism 07/02/2016   Sleep apnea on CPAP 07/02/2016     Current Outpatient Medications on File Prior to Visit  Medication Sig Dispense Refill   amLODipine (NORVASC) 5 MG tablet TAKE 1 TABLET (5 MG TOTAL) BY MOUTH DAILY. 90 tablet 0   Cholecalciferol (VITAMIN D3) 10000 units capsule Take 10,000 Units by mouth daily.     hydrochlorothiazide (HYDRODIURIL) 25 MG tablet Take 1 tablet (25 mg total) by mouth daily. 90 tablet 0   metFORMIN (GLUCOPHAGE) 500 MG tablet Take 2 tablets (1,000 mg total) by mouth 2 (two) times daily with a meal. 120 tablet 3   mupirocin ointment (BACTROBAN) 2 % Apply 1 application topically 2 (two) times daily. 60 g 0   sildenafil (VIAGRA) 100 MG tablet Take 1 tablet 1/2 hour to 1 hour prior to intercourse as needed. Limit use to 1/2 tablet or 1 tablet per 24 hours. 30 tablet 3   testosterone cypionate (DEPOTESTOSTERONE CYPIONATE) 200 MG/ML injection Inject 0.5 mLs (100 mg total) into the muscle 2 (two) times a week. 10 mL 3   thyroid (ARMOUR THYROID) 60 MG tablet Take 1 tablet (60 mg total) by mouth daily before breakfast. 90 tablet 1   No current facility-administered medications on file prior to visit.    Allergies  Allergen Reactions   Penicillins Anaphylaxis    Has patient had a PCN reaction causing immediate rash,  facial/tongue/throat swelling, SOB or lightheadedness with hypotension: Yes Has patient had a PCN reaction causing severe rash involving mucus membranes or skin necrosis: Yes Has patient had a PCN reaction that required hospitalization Yes Has patient had a PCN reaction occurring within the last 10 years: No If all of the above answers are "NO", then may proceed with Cephalosporin use.     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   Social Determinants of Health   Financial Resource Strain: Not on file  Food Insecurity: Not on file  Transportation Needs: Not on file  Physical Activity: Not on file  Stress: Not on file  Social Connections: Not on file  Intimate Partner Violence: Not on file    Family History  Problem Relation Age  of Onset   Diabetes Mother    Hypertension Mother    Multiple myeloma Sister     Past Surgical History:  Procedure Laterality Date   KNEE SURGERY Left 2005    ROS: Review of Systems Negative except as stated above  PHYSICAL EXAM: There were no vitals taken for this visit.  Physical Exam  {male adult master:310786} {male adult master:310785}     Latest Ref Rng & Units 02/03/2022   10:55 AM 06/14/2021    9:06 AM 03/08/2021    8:46 AM  CMP  Glucose 70 - 99 mg/dL 116  118  327   BUN 6 - 24 mg/dL _0 Creatinine 0.76 - 1.27 mg/dL 1.34  1.10  1.06   Sodium 134 - 144  mmol/L 139  136  136   Potassium 3.5 - 5.2 mmol/L 4.7  4.0  4.6   Chloride 96 - 106 mmol/L 99  101  96   CO2 20 - 29 mmol/L _1 Calcium 8.7 - 10.2 mg/dL 10.1  9.4  9.5   Total Protein 6.1 - 8.1 g/dL   7.5   Total Bilirubin 0.2 - 1.2 mg/dL   1.1   AST 10 - 40 U/L   21   ALT 9 - 46 U/L   23    Lipid Panel     Component Value Date/Time   CHOL 194 07/13/2020 1451   TRIG 187 (H) 07/13/2020 1451   HDL 43 07/13/2020 1451   CHOLHDL 4.5 07/13/2020 1451   LDLCALC 121 (H) 07/13/2020 1451    CBC    Component Value Date/Time   WBC 9.5 07/13/2020 1451   RBC 4.82 07/13/2020 1451   HGB 15.0 07/13/2020 1451   HCT 44.6 07/13/2020 1451   PLT 250 07/13/2020 1451   MCV 92.5 07/13/2020 1451   MCH 31.1 07/13/2020 1451   MCHC 33.6 07/13/2020 1451   RDW 12.1 07/13/2020 1451   LYMPHSABS 1.5 02/16/2017 1932   MONOABS 0.9 02/16/2017 1932   EOSABS 0.2 02/16/2017 1932   BASOSABS 0.0 02/16/2017 1932    ASSESSMENT AND PLAN:  There are no diagnoses linked to this encounter.   Patient was given the opportunity to ask questions.  Patient verbalized understanding of the plan and was able to repeat key elements of the plan. Patient was given clear instructions to go to Emergency Department or return to medical center if symptoms don't improve, worsen, or new problems develop.The patient verbalized understanding.   No orders of the defined types were placed in this encounter.    Requested Prescriptions    No prescriptions requested or ordered in this encounter    No follow-ups on file.  Camillia Herter, NP

## 2022-10-03 ENCOUNTER — Ambulatory Visit: Payer: BC Managed Care – PPO | Admitting: Family

## 2022-10-03 DIAGNOSIS — Z13220 Encounter for screening for lipoid disorders: Secondary | ICD-10-CM

## 2022-10-03 DIAGNOSIS — E119 Type 2 diabetes mellitus without complications: Secondary | ICD-10-CM

## 2022-10-03 DIAGNOSIS — I10 Essential (primary) hypertension: Secondary | ICD-10-CM

## 2022-10-03 DIAGNOSIS — E039 Hypothyroidism, unspecified: Secondary | ICD-10-CM

## 2022-11-29 ENCOUNTER — Other Ambulatory Visit: Payer: Self-pay | Admitting: Family

## 2022-11-29 DIAGNOSIS — I10 Essential (primary) hypertension: Secondary | ICD-10-CM

## 2022-11-29 DIAGNOSIS — E119 Type 2 diabetes mellitus without complications: Secondary | ICD-10-CM

## 2022-12-29 ENCOUNTER — Ambulatory Visit: Payer: BC Managed Care – PPO | Admitting: Family

## 2023-01-02 ENCOUNTER — Other Ambulatory Visit: Payer: Self-pay | Admitting: Family

## 2023-01-02 DIAGNOSIS — I10 Essential (primary) hypertension: Secondary | ICD-10-CM

## 2023-01-03 MED ORDER — HYDROCHLOROTHIAZIDE 25 MG PO TABS: 25.0000 mg | ORAL_TABLET | Freq: Every day | ORAL | 0 refills | Status: DC

## 2023-01-16 ENCOUNTER — Other Ambulatory Visit: Payer: Self-pay | Admitting: Family

## 2023-01-16 DIAGNOSIS — E119 Type 2 diabetes mellitus without complications: Secondary | ICD-10-CM

## 2023-01-17 NOTE — Telephone Encounter (Signed)
Called pt to make appt. Pt stated he had to cancel the Feb appt because his father is sick. Pt stated he will call back to make appointment. Last RF 05/20/22 and expired 09/17/22- Requested Prescriptions  Pending Prescriptions Disp Refills   TRULICITY 1.5 SE/8.3TD SOPN [Pharmacy Med Name: TRULICITY 1.5 VV/6.1 ML PEN]  3    Sig: INJECT 1.5 MG INTO THE SKIN ONCE A WEEK.     Endocrinology:  Diabetes - GLP-1 Receptor Agonists Failed - 01/16/2023 12:24 PM      Failed - HBA1C is between 0 and 7.9 and within 180 days    Hgb A1c MFr Bld  Date Value Ref Range Status  05/20/2022 7.1 (H) 4.8 - 5.6 % Final    Comment:             Prediabetes: 5.7 - 6.4          Diabetes: >6.4          Glycemic control for adults with diabetes: <7.0          Failed - Valid encounter within last 6 months    Recent Outpatient Visits           7 months ago Essential (primary) hypertension   [facility] [facility], Amy J, NP   8 months ago Type 2 diabetes mellitus without complication, without long-term current use of insulin ([place])   [place] [facility], Amy J, NP   10 months ago Type 2 diabetes mellitus without complication, without long-term current use of insulin ([place])   [place] [facility], Amy J, NP   11 months ago Type 2 diabetes mellitus without complication, without long-term current use of insulin [facility])   [place] [facility], [place], NP   1 year ago Encounter to establish care   [facility], Flonnie Hail[place], NP

## 2023-01-19 ENCOUNTER — Encounter: Payer: Self-pay | Admitting: Family

## 2023-01-24 ENCOUNTER — Ambulatory Visit: Payer: BC Managed Care – PPO | Admitting: Family

## 2023-02-08 NOTE — Progress Notes (Signed)
Patient ID: KABLE RETH, male    DOB: [birth year]  MRN: [id number]  [id number] Care Management   Subjective: Vincent Ruiz is a 52 y.o. male who presents for chronic care management.  His concerns today include:  - Doing well on blood pressure medications, no issues/concerns. He denies red flag symptoms such as but not limited to chest pain, shortness of breath, worst headache of life, nausea/vomiting. He is not checking blood pressure outside of office.  - Doing well on diabetes medications, no issues/concerns. Reports has not taken Trulicity in 4 weeks due to needing refills.  - States has not taken thyroid medication in a while without specific cause.  Patient Active Problem List   Diagnosis Date Noted   Bipolar 2 disorder, major depressive episode ([place]) 07/03/2016   Hypertension 07/02/2016   Morbid obesity ([place]) 07/02/2016   Type 2 diabetes mellitus ([place]) 07/02/2016   Hypothyroidism 07/02/2016   Sleep apnea on CPAP 07/02/2016     Current Outpatient Medications on File Prior to Visit  Medication Sig Dispense Refill   Cholecalciferol (VITAMIN D3) 10000 units capsule Take 10,000 Units by mouth daily.     hydrochlorothiazide (HYDRODIURIL) 25 MG tablet Take 1 tablet (25 mg total) by mouth daily. 90 tablet 0   mupirocin ointment (BACTROBAN) 2 % Apply 1 application topically 2 (two) times daily. 60 g 0   sildenafil (VIAGRA) 100 MG tablet Take 1 tablet 1/2 hour to 1 hour prior to intercourse as needed. Limit use to 1/2 tablet or 1 tablet per 24 hours. 30 tablet 3   No current facility-administered medications on file prior to visit.    Allergies  Allergen Reactions   Penicillins Anaphylaxis    Has patient had a PCN reaction causing immediate rash, facial/tongue/throat swelling, SOB or lightheadedness with hypotension: Yes Has patient had a PCN reaction causing severe rash involving mucus membranes or skin necrosis: Yes Has patient had a PCN reaction that required  hospitalization Yes Has patient had a PCN reaction occurring within the last 10 years: No If all of the above answers are "NO", then may proceed with Cephalosporin use.     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   Social Determinants of Health   Financial Resource Strain: Not on file  Food Insecurity: Not on file  Transportation Needs: Not on file  Physical Activity: Not on file  Stress: Not on file  Social Connections: Not on file  Intimate Partner Violence: Not on file    Family History  Problem Relation Age of Onset   Diabetes Mother    Hypertension Mother    Multiple myeloma Sister     Past Surgical History:  Procedure Laterality Date   KNEE SURGERY Left 2005    ROS: Review of Systems Negative except as stated above  PHYSICAL EXAM: BP 132/78 (BP Location: Left Arm, Patient Position: Sitting, Cuff Size: Large)   Pulse 92   Temp 98.3 F (36.8 C)   Resp 16   Ht '5\' 10"'$  (1.778 m)   Wt (!) 398 lb (180.5 kg)   SpO2 93%   BMI 57.11 kg/m   Physical Exam HENT:     Head: Normocephalic and atraumatic.  Eyes:     Extraocular Movements: Extraocular movements intact.     Conjunctiva/sclera: Conjunctivae normal.     Pupils: Pupils are equal, round, and reactive to light.  Cardiovascular:     Rate and Rhythm: Normal rate and regular rhythm.     Pulses: Normal pulses.     Heart sounds: Normal heart  sounds.  Pulmonary:     Effort: Pulmonary effort is normal.     Breath sounds: Normal breath sounds.  Musculoskeletal:     Cervical back: Normal range of motion and neck supple.  Neurological:     General: No focal deficit present.     Mental Status: He is alert and oriented to person, place, and time.  Psychiatric:        Mood and Affect: Mood normal.        Behavior: Behavior normal.    Results for orders placed or performed in visit on 02/15/23  POCT glycosylated hemoglobin (Hb A1C)  Result Value Ref Range   Hemoglobin A1C 9.2 (A) 4.0 - 5.6 %   HbA1c POC (<> result, manual entry)     HbA1c, POC (prediabetic range)     HbA1c, POC (controlled diabetic range)     ASSESSMENT AND PLAN: 1. Primary hypertension - Continue Hydrochlorothiazide as prescribed. No refills needed as of present.  - Continue Amlodipine as prescribed.  - Routine screening.  - Counseled on blood pressure goal of less than 130/80, low-sodium, DASH diet, medication compliance, and 150 minutes of moderate intensity exercise per week as tolerated. Counseled on medication adherence and adverse effects. - Follow-up with primary provider in 3 months or sooner if needed.  - Basic Metabolic Panel - amLODipine (NORVASC) 5 MG tablet; Take 1 tablet (5 mg total) by mouth daily.  Dispense: 30 tablet; Refill: 2  2. Type 2 diabetes mellitus without complication, without long-term current use of insulin (HCC) - Hemoglobin A1c not at goal at 9.2%, goal 7%. This is increased from previous 7.1%.  - Continue Metformin as prescribed.  - Increase Dulaglutide from 1.5 mg weekly to 3 mg weekly.  - Discussed the importance of healthy eating habits, low-carbohydrate diet, low-sugar diet, regular aerobic exercise (at least 150 minutes a week as tolerated) and medication compliance to achieve or maintain control of diabetes. - Follow-up with primary provider in 4 weeks or sooner if needed.  - POCT glycosylated hemoglobin (Hb A1C) -  metFORMIN (GLUCOPHAGE) 500 MG tablet; Take 2 tablets (1,000 mg total) by mouth 2 (two) times daily with a meal.  Dispense: 120 tablet; Refill: 2 - Dulaglutide 3 MG/0.5ML SOPN; Inject 3 mg into the skin once a week.  Dispense: 2 mL; Refill: 2  3. Hypothyroidism, unspecified type - Routine screening.  - Will update thyroid medication regimen once lab results.  - TSH   Patient was given the opportunity to ask questions.  Patient verbalized understanding of the plan and was able to repeat key elements of the plan. Patient was given clear instructions to go to Emergency Department or return to medical center if symptoms don't improve, worsen, or new problems develop.The patient verbalized  understanding.   Orders Placed This Encounter  Procedures   TSH   Basic Metabolic Panel   POCT glycosylated hemoglobin (Hb A1C)     Requested Prescriptions   Signed Prescriptions Disp Refills   amLODipine (NORVASC) 5 MG tablet 30 tablet 2    Sig: Take 1 tablet (5 mg total) by mouth daily.   metFORMIN (GLUCOPHAGE) 500 MG tablet 120 tablet 2    Sig: Take 2 tablets (1,000 mg total) by mouth 2 (two) times daily with a meal.   Dulaglutide 3 MG/0.5ML SOPN 2 mL 2    Sig: Inject 3 mg into the skin once a week.    Return in about 3 months (around 05/16/2023) for Follow-Up or next available chronic care mgmt  and 4 weeks DM.  Camillia Herter, NP

## 2023-02-15 ENCOUNTER — Encounter: Payer: Self-pay | Admitting: Family

## 2023-02-15 ENCOUNTER — Ambulatory Visit (INDEPENDENT_AMBULATORY_CARE_PROVIDER_SITE_OTHER): Payer: BC Managed Care – PPO | Admitting: Family

## 2023-02-15 VITALS — BP 132/78 | HR 92 | Temp 98.3°F | Resp 16 | Ht 70.0 in | Wt 398.0 lb

## 2023-02-15 DIAGNOSIS — E039 Hypothyroidism, unspecified: Secondary | ICD-10-CM | POA: Diagnosis not present

## 2023-02-15 DIAGNOSIS — E119 Type 2 diabetes mellitus without complications: Secondary | ICD-10-CM

## 2023-02-15 DIAGNOSIS — I10 Essential (primary) hypertension: Secondary | ICD-10-CM

## 2023-02-15 LAB — POCT GLYCOSYLATED HEMOGLOBIN (HGB A1C): Hemoglobin A1C: 9.2 % — AB (ref 4.0–5.6)

## 2023-02-15 MED ORDER — AMLODIPINE BESYLATE 5 MG PO TABS: 5.0000 mg | ORAL_TABLET | Freq: Every day | ORAL | 2 refills | Status: DC

## 2023-02-15 MED ORDER — THYROID 60 MG PO TABS: 60.0000 mg | ORAL_TABLET | Freq: Every day | ORAL | 2 refills | Status: DC

## 2023-02-15 MED ORDER — METFORMIN HCL 500 MG PO TABS: 1000.0000 mg | ORAL_TABLET | Freq: Two times a day (BID) | ORAL | 2 refills | Status: DC

## 2023-02-15 MED ORDER — DULAGLUTIDE 3 MG/0.5ML ~~LOC~~ SOAJ: 3.0000 mg | SUBCUTANEOUS | 2 refills | Status: DC

## 2023-02-15 NOTE — Progress Notes (Signed)
.  Pt presents for chronic care management   

## 2023-02-16 ENCOUNTER — Other Ambulatory Visit: Payer: Self-pay | Admitting: Family

## 2023-02-16 DIAGNOSIS — E039 Hypothyroidism, unspecified: Secondary | ICD-10-CM

## 2023-02-16 LAB — BASIC METABOLIC PANEL
BUN/Creatinine Ratio: 16 (ref 9–20)
BUN: 19 mg/dL (ref 6–24)
CO2: 23 mmol/L (ref 20–29)
Calcium: 9.6 mg/dL (ref 8.7–10.2)
Chloride: 95 mmol/L — ABNORMAL LOW (ref 96–106)
Creatinine, Ser: 1.19 mg/dL (ref 0.76–1.27)
Glucose: 268 mg/dL — ABNORMAL HIGH (ref 70–99)
Potassium: 4.3 mmol/L (ref 3.5–5.2)
Sodium: 139 mmol/L (ref 134–144)
eGFR: 75 mL/min/{1.73_m2} (ref >59)

## 2023-02-16 LAB — TSH: TSH: 4.22 u[IU]/mL (ref 0.450–4.500)

## 2023-02-16 MED ORDER — THYROID 60 MG PO TABS: 60.0000 mg | ORAL_TABLET | Freq: Every day | ORAL | 2 refills | Status: DC

## 2023-04-05 ENCOUNTER — Ambulatory Visit: Payer: Self-pay

## 2023-04-05 NOTE — Progress Notes (Signed)
Patient ID: Vincent Ruiz, male    DOB: Nov 20, 1971  MRN: [id number]  CC: Follow-Up  Subjective: Vincent Ruiz is a 52 y.o. male who presents for follow-up.   His concerns today include:  04/05/2023 per triage RN call note: Chief Complaint: heartburn Symptoms: heartburn in R and middle chest and throat, comes and goes Frequency: couple of weeks  Pertinent Negatives: Patient denies N/V, dizziness Disposition: ED /[] Urgent Care (no appt availability in office) / Appointment(In office/virtual)/  [facility]/ Home Care/ Refused Recommended Disposition /[x] [facility]/  Follow-up with PCP Additional Notes: pt states he has tried Tums and Mylanta, gets slight relief with Mylanta but still has heartburn. Pt requesting sooner appt, added to waitlist and offered MU today or tomorrow and recommended pt can try OTC Prilosec to help as well. Pt would rather wait and see PCP on 04/10/23 but will try OTC. Advised to CB if sx get worse. Pt verbalized understanding.       Summary: Pt experiencing severe heartburn and would like to speak with a nurse.    Pt stated he is experiencing severe heartburn and would like to speak with a nurse. Pt stated even if he just drinks water he has heartburn. Pt scheduled for an appt on 04/10/23 but is concerned about having to go throughout the weekend dealing with this. Cb# [phone]      Reason for Disposition  [1] Patient says chest pain feels exactly the same as previously diagnosed "heartburn" AND [2] describes burning in chest AND [3] accompanying sour taste in mouth  Answer Assessment - Initial Assessment Questions 1. LOCATION: "Where does it hurt?"       R chest and middle  3. ONSET: "When did the chest pain begin?" (Minutes, hours or days)      Couple of weeks  4. PATTERN: "Does the pain come and go, or has it been constant since it started?"  "Does it get worse with exertion?"      Comes and goes  6. SEVERITY:  "How bad is the pain?"  (e.g., Scale 1-10; mild, moderate, or severe)    - MILD (1-3): doesn't interfere with normal activities     - MODERATE (4-7): interferes with normal activities or awakens from sleep    - SEVERE (8-10): excruciating pain, unable to do any normal activities       Mild to moderate  9. CAUSE: "What do you think is causing the chest pain?"     Heartburn  10. OTHER SYMPTOMS: "Do you have any other symptoms?" (e.g., dizziness, nausea, vomiting, sweating, fever, difficulty breathing, cough)       no  Protocols used: Chest Pain-A-AH  Today's visit 04/10/2023: - Patient's acid reflux report consistent with triage RN call note. He has tried several over-the-counter medications for acid reflux without relief. He denies red flag symptoms associated with acid reflux. Reports he has not noticed any particular foods or beverages which cause acid reflux.  - Reports he plans to return for office visit on next month for diabetes follow-up. He declined hemoglobin A1c testing on today.  Patient Active Problem List   Diagnosis Date Noted   Bipolar 2 disorder, major depressive episode 07/03/2016   Hypertension 07/02/2016   Morbid obesity 07/02/2016   Type 2 diabetes mellitus 07/02/2016   Hypothyroidism 07/02/2016   Sleep apnea on CPAP 07/02/2016     Current Outpatient Medications on File Prior to Visit  Medication Sig Dispense Refill   amLODipine (  NORVASC) 5 MG tablet Take 1 tablet (5 mg total) by mouth daily. 30 tablet 2   Cholecalciferol (VITAMIN D3) 10000 units capsule Take 10,000 Units by mouth daily.     Dulaglutide 3 MG/0.5ML SOPN Inject 3 mg into the skin once a week. 2 mL 2   hydrochlorothiazide (HYDRODIURIL) 25 MG tablet Take 1 tablet (25 mg total) by mouth daily. 90 tablet 0   metFORMIN (GLUCOPHAGE) 500 MG tablet Take 2 tablets (1,000 mg total) by mouth 2 (two) times daily with a meal. 120 tablet 2   No current facility-administered medications on file prior to visit.     Allergies  Allergen Reactions   Penicillins Anaphylaxis    Has patient had a PCN reaction causing immediate rash, facial/tongue/throat swelling, SOB or lightheadedness with hypotension: Yes Has patient had a PCN reaction causing severe rash involving mucus membranes or skin necrosis: Yes Has patient had a PCN reaction that required hospitalization Yes Has patient had a PCN reaction occurring within the last 10 years: No If all of the above answers are "NO", then may proceed with Cephalosporin use.     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   Social Determinants of Health   Financial Resource Strain: Patient Declined (04/09/2023)   Overall Financial Resource Strain (CARDIA)    Difficulty of Paying Living Expenses: Patient declined  Food Insecurity: Patient Declined (04/09/2023)   Hunger Vital Sign    Worried About Running Out of Food in the Last Year: Patient declined    Ran Out of Food in the Last Year: Patient declined  Transportation Needs: Unmet Transportation Needs (04/09/2023)   PRAPARE - Administrator, Civil Service (Medical): Yes    Lack of Transportation (Non-Medical): Yes  Physical Activity: Unknown (04/09/2023)   Exercise Vital Sign    Days of Exercise per Week: 2 days    Minutes of Exercise per Session: Patient declined  Stress: Stress Concern Present (04/09/2023)   [employer] of Occupational Health - Occupational Stress Questionnaire    Feeling of Stress : Rather much  Social Connections: Unknown (04/09/2023)   Social Connection and Isolation Panel [NHANES]    Frequency of Communication with Friends and Family: More than three times a week    Frequency of Social Gatherings with Friends and Family: Patient declined    Attends Religious Services: Patient declined    Database administrator or Organizations: No    Attends Engineer, structural: Not on file    Marital Status: Married  Catering manager Violence: Not on file    Family History  Problem Relation Age of Onset   Diabetes Mother    Hypertension Mother    Multiple myeloma Sister     Past Surgical History:  Procedure Laterality Date   KNEE SURGERY  Left 2005    ROS: Review of Systems Negative except as stated above  PHYSICAL EXAM: BP 131/85 (BP Location: Right Arm, Patient Position: Sitting, Cuff Size: Large)   Pulse 88   Temp 98.4 F (36.9 C) (Oral)   Resp 20   Wt (!) 397 lb (180.1 kg)   SpO2 97%   BMI 56.96 kg/m   Physical Exam HENT:     Head: Normocephalic and atraumatic.  Eyes:     Extraocular Movements: Extraocular movements intact.     Conjunctiva/sclera: Conjunctivae normal.     Pupils: Pupils are equal, round, and reactive to light.  Cardiovascular:     Rate and Rhythm: Normal rate and regular rhythm.     Pulses: Normal pulses.     Heart sounds: Normal heart sounds.  Pulmonary:     Effort: Pulmonary effort is normal.     Breath sounds: Normal breath sounds.  Musculoskeletal:     Cervical back: Normal range of motion and neck supple.  Neurological:     General: No focal deficit present.     Mental Status: He is alert and oriented to person, place, and time.  Psychiatric:        Mood and Affect: Mood normal.        Behavior: Behavior normal.     ASSESSMENT AND PLAN: 1. Gastroesophageal reflux disease, unspecified whether esophagitis present -  Omeprazole and Sucralfate as prescribed. Counseled on medication adherence/adverse effects.  - Referral to Gastroenterology for further evaluation/management. During the interim follow-up with primary provider as scheduled.  - omeprazole (PRILOSEC) 20 MG capsule; Take 1 capsule (20 mg total) by mouth 2 (two) times daily before a meal.  Dispense: 60 capsule; Refill: 1 - sucralfate (CARAFATE) 1 GM/10ML suspension; Take 10 mLs (1 g total) by mouth 2 (two) times daily.  Dispense: 420 mL; Refill: 0 - Ambulatory referral to Gastroenterology   Patient was given the opportunity to ask questions.  Patient verbalized understanding of the plan and was able to repeat key elements of the plan. Patient was given clear instructions to go to Emergency Department or return to medical center if symptoms don't improve, worsen, or new problems develop.The patient verbalized understanding.   Orders Placed This Encounter  Procedures   Ambulatory referral to Gastroenterology     Requested Prescriptions   Signed Prescriptions Disp Refills   omeprazole (PRILOSEC) 20 MG capsule 60 capsule 1    Sig: Take 1 capsule (20 mg total) by mouth 2 (two) times daily before a meal.   sucralfate (CARAFATE) 1 GM/10ML suspension 420 mL 0    Sig: Take 10 mLs (1 g total) by mouth 2 (two) times daily.    Follow-up with primary provider as scheduled.   Rema Fendt, NP

## 2023-04-05 NOTE — Telephone Encounter (Signed)
  Chief Complaint: heartburn Symptoms: heartburn in R and middle chest and throat, comes and goes Frequency: couple of weeks  Pertinent Negatives: Patient denies N/V, dizziness Disposition: ED /[] Urgent Care (no appt availability in office) / Appointment(In office/virtual)/  [facility]/ Home Care/ Refused Recommended Disposition /[x] [facility]/  Follow-up with PCP Additional Notes: pt states he has tried Tums and Mylanta, gets slight relief with Mylanta but still has heartburn. Pt requesting sooner appt, added to waitlist and offered MU today or tomorrow and recommended pt can try OTC Prilosec to help as well. Pt would rather wait and see PCP on 04/10/23 but will try OTC. Advised to CB if sx get worse. Pt verbalized understanding.   Summary: Pt experiencing severe heartburn and would like to speak with a nurse.   Pt stated he is experiencing severe heartburn and would like to speak with a nurse. Pt stated even if he just drinks water he has heartburn. Pt scheduled for an appt on 04/10/23 but is concerned about having to go throughout the weekend dealing with this. Cb# [phone]     Reason for Disposition  [1] Patient says chest pain feels exactly the same as previously diagnosed "heartburn" AND [2] describes burning in chest AND [3] accompanying sour taste in mouth  Answer Assessment - Initial Assessment Questions 1. LOCATION: "Where does it hurt?"       R chest and middle  3. ONSET: "When did the chest pain begin?" (Minutes, hours or days)      Couple of weeks  4. PATTERN: "Does the pain come and go, or has it been constant since it started?"  "Does it get worse with exertion?"      Comes and goes  6. SEVERITY: "How bad is the pain?"  (e.g., Scale 1-10; mild, moderate, or severe)    - MILD (1-3): doesn't interfere with normal activities     - MODERATE (4-7): interferes with normal activities or awakens from sleep    - SEVERE (8-10): excruciating pain,  unable to do any normal activities       Mild to moderate  9. CAUSE: "What do you think is causing the chest pain?"     Heartburn  10. OTHER SYMPTOMS: "Do you have any other symptoms?" (e.g., dizziness, nausea, vomiting, sweating, fever, difficulty breathing, cough)       no  Protocols used: Chest Pain-A-AH

## 2023-04-10 ENCOUNTER — Other Ambulatory Visit: Payer: Self-pay | Admitting: Family

## 2023-04-10 ENCOUNTER — Ambulatory Visit (INDEPENDENT_AMBULATORY_CARE_PROVIDER_SITE_OTHER): Payer: BC Managed Care – PPO | Admitting: Family

## 2023-04-10 VITALS — BP 131/85 | HR 88 | Temp 98.4°F | Resp 20 | Wt 397.0 lb

## 2023-04-10 DIAGNOSIS — K219 Gastro-esophageal reflux disease without esophagitis: Secondary | ICD-10-CM | POA: Diagnosis not present

## 2023-04-10 DIAGNOSIS — E119 Type 2 diabetes mellitus without complications: Secondary | ICD-10-CM

## 2023-04-10 MED ORDER — SUCRALFATE 1 GM/10ML PO SUSP: 1.0000 g | Freq: Two times a day (BID) | ORAL | 0 refills | Status: DC

## 2023-04-10 MED ORDER — SUCRALFATE 1 G PO TABS: 1.0000 g | ORAL_TABLET | Freq: Two times a day (BID) | ORAL | 0 refills | Status: DC

## 2023-04-10 MED ORDER — OMEPRAZOLE 20 MG PO CPDR: 20.0000 mg | DELAYED_RELEASE_CAPSULE | Freq: Two times a day (BID) | ORAL | 1 refills | Status: DC

## 2023-04-10 NOTE — Telephone Encounter (Signed)
Complete

## 2023-04-10 NOTE — Progress Notes (Signed)
C/o heartburn Tried Pepto, Mylanta, Tums Mylanta Discomfort from mid chest to throat. Denies discomfort today

## 2023-04-21 ENCOUNTER — Other Ambulatory Visit: Payer: Self-pay | Admitting: Family Medicine

## 2023-04-21 DIAGNOSIS — E119 Type 2 diabetes mellitus without complications: Secondary | ICD-10-CM

## 2023-04-21 NOTE — Telephone Encounter (Signed)
Requested medication (s) are due for refill today: yes  Requested medication (s) are on the active medication list: yes  Last refill:  02/15/23  Future visit scheduled: yes  Notes to clinic:  Pharmacy comment: Product Backordered/Unavailable:PLEASE SEND ALTERNATIVE. THANKS.      Requested Prescriptions  Pending Prescriptions Disp Refills   TRULICITY 3 MG/0.5ML SOPN [Pharmacy Med Name: TRULICITY 3 MG/0.5 ML PEN]  2    Sig: INJECT 3 MG INTO THE SKIN ONE TIME PER WEEK     Endocrinology:  Diabetes - GLP-1 Receptor Agonists Failed - 04/21/2023 11:21 AM      Failed - HBA1C is between 0 and 7.9 and within 180 days    Hemoglobin A1C  Date Value Ref Range Status  02/15/2023 9.2 (A) 4.0 - 5.6 % Final   Hgb A1c MFr Bld  Date Value Ref Range Status  05/20/2022 7.1 (H) 4.8 - 5.6 % Final    Comment:             Prediabetes: 5.7 - 6.4          Diabetes: >6.4          Glycemic control for adults with diabetes: <7.0          Passed - Valid encounter within last 6 months    Recent Outpatient Visits           1 week ago Gastroesophageal reflux disease, unspecified whether esophagitis present   [facility], [place], NP   2 months ago Primary hypertension   [facility], Amy J, NP   10 months ago Essential (primary) hypertension   [place] [facility], Amy J, NP   11 months ago Type 2 diabetes mellitus without complication, without long-term current use of insulin (HCC)   [place] [facility] at [facility], Amy J, NP   1 year ago Type 2 diabetes mellitus without complication, without long-term current use of insulin [facility])   [place] [facility], [place], NP       Future Appointments             In 3 weeks Rema Fendt, NP [facility]

## 2023-04-25 ENCOUNTER — Other Ambulatory Visit: Payer: Self-pay | Admitting: Family

## 2023-04-25 DIAGNOSIS — E119 Type 2 diabetes mellitus without complications: Secondary | ICD-10-CM

## 2023-04-25 MED ORDER — SEMAGLUTIDE (1 MG/DOSE) 4 MG/3ML ~~LOC~~ SOPN: 0.2500 mg | PEN_INJECTOR | SUBCUTANEOUS | 0 refills | Status: DC

## 2023-04-25 NOTE — Telephone Encounter (Signed)
Requested medications are due for refill today.  no  Requested medications are on the active medications list.  yes  Last refill. 04/25/2023  Future visit scheduled.   yes  Notes to clinic.  Pharmacy comment: Script Clarification:PLEASE CLARIFY DOSE.     Requested Prescriptions  Pending Prescriptions Disp Refills   OZEMPIC, 1 MG/DOSE, 4 MG/3ML SOPN [Pharmacy Med Name: OZEMPIC 4 MG/3 ML (1 MG/DOSE)]  0    Sig: INJECT 0.25MG  INTO THE SKIN ONE TIME PER WEEK     Endocrinology:  Diabetes - GLP-1 Receptor Agonists - semaglutide Failed - 04/25/2023  2:32 PM      Failed - HBA1C in normal range and within 180 days    Hemoglobin A1C  Date Value Ref Range Status  02/15/2023 9.2 (A) 4.0 - 5.6 % Final   Hgb A1c MFr Bld  Date Value Ref Range Status  05/20/2022 7.1 (H) 4.8 - 5.6 % Final    Comment:             Prediabetes: 5.7 - 6.4          Diabetes: >6.4          Glycemic control for adults with diabetes: <7.0          Passed - Cr in normal range and within 360 days    Creat  Date Value Ref Range Status  06/14/2021 1.10 0.60 - 1.35 mg/dL Final   Creatinine, Ser  Date Value Ref Range Status  02/15/2023 1.19 0.76 - 1.27 mg/dL Final         Passed - Valid encounter within last 6 months    Recent Outpatient Visits           2 weeks ago Gastroesophageal reflux disease, unspecified whether esophagitis present   [place] [facility], [place], NP   2 months ago Primary hypertension   [place] [facility], Amy J, NP   10 months ago Essential (primary) hypertension   [place] [facility], Amy J, NP   11 months ago Type 2 diabetes mellitus without complication, without long-term current use of insulin (HCC)   [place] [facility] at [facility], Amy J, NP   1 year ago Type 2 diabetes mellitus without complication, without long-term current use of insulin [facility])   [place] [facility] at [facility], [place], NP       Future Appointments             In 3 weeks Rema Fendt, NP [facility]

## 2023-04-25 NOTE — Telephone Encounter (Signed)
Semaglutide prescribed.

## 2023-05-09 NOTE — Progress Notes (Deleted)
Patient ID: Vincent Ruiz, male    DOB: October 14, 1971  MRN: [id number]  CC: Chronic Care Management   Subjective: Vincent Ruiz is a 52 y.o. male who presents for chronic care management.  His concerns today include:  HTN - HCTZ, Amlodipine DM - Metformin, Semaglutide Hypothyroidism - [place] Thyroid   Patient Active Problem List   Diagnosis Date Noted   Bipolar 2 disorder, major depressive episode (HCC) 07/03/2016   Hypertension 07/02/2016   Morbid obesity (HCC) 07/02/2016   Type 2 diabetes mellitus (HCC) 07/02/2016   Hypothyroidism 07/02/2016   Sleep apnea on CPAP 07/02/2016     Current Outpatient Medications on File Prior to Visit  Medication Sig Dispense Refill   amLODipine (NORVASC) 5 MG tablet Take 1 tablet (5 mg total) by mouth daily. 30 tablet 2   Cholecalciferol (VITAMIN D3) 10000 units capsule Take 10,000 Units by mouth daily.     hydrochlorothiazide (HYDRODIURIL) 25 MG tablet Take 1 tablet (25 mg total) by mouth daily. 90 tablet 0   metFORMIN (GLUCOPHAGE) 500 MG tablet Take 2 tablets (1,000 mg total) by mouth 2 (two) times daily with a meal. 120 tablet 2   omeprazole (PRILOSEC) 20 MG capsule Take 1 capsule (20 mg total) by mouth 2 (two) times daily before a meal. 60 capsule 1   Semaglutide, 1 MG/DOSE, 4 MG/3ML SOPN Inject 0.25 mg into the skin once a week. 3 mL 0   sucralfate (CARAFATE) 1 g tablet Take 1 tablet (1 g total) by mouth 2 (two) times daily. 60 tablet 0   No current facility-administered medications on file prior to visit.    Allergies  Allergen Reactions   Penicillins Anaphylaxis    Has patient had a PCN reaction causing immediate rash, facial/tongue/throat swelling, SOB or lightheadedness with hypotension: Yes Has patient had a PCN reaction causing severe rash involving mucus membranes or skin necrosis: Yes Has patient had a PCN reaction that required hospitalization Yes Has patient had a PCN reaction occurring within the last 10 years: No If  all of the above answers are "NO", then may proceed with Cephalosporin use.     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   Social Determinants of Health   Financial Resource Strain: Patient Declined (04/09/2023)   Overall Financial Resource Strain (CARDIA)    Difficulty of Paying Living Expenses: Patient declined  Food Insecurity: Patient Declined (04/09/2023)   Hunger Vital Sign    Worried About Running Out of Food in the Last Year: Patient declined    Ran Out of Food in the Last Year: Patient declined  Transportation Needs: Unmet Transportation Needs (04/09/2023)   PRAPARE - Administrator, Civil Service (Medical): Yes    Lack of Transportation (Non-Medical): Yes  Physical Activity: Unknown (04/09/2023)   Exercise  Vital Sign    Days of Exercise per Week: 2 days    Minutes of Exercise per Session: Patient declined  Stress: Stress Concern Present (04/09/2023)   [employer] of Occupational Health - Occupational Stress Questionnaire    Feeling of Stress : Rather much  Social Connections: Unknown (04/09/2023)   Social Connection and Isolation Panel [NHANES]    Frequency of Communication with Friends and Family: More than three times a week    Frequency of Social Gatherings with Friends and Family: Patient declined    Attends Religious Services: Patient declined    Database administrator or  Organizations: No    Attends Engineer, structural: Not on file    Marital Status: Married  Catering manager Violence: Not on file    Family History  Problem Relation Age of Onset   Diabetes Mother    Hypertension Mother    Multiple myeloma Sister     Past Surgical History:  Procedure Laterality Date   KNEE SURGERY Left 2005    ROS: Review of Systems Negative except as stated above  PHYSICAL EXAM: There were no vitals taken for this visit.  Physical Exam  {male adult master:310786} {male adult master:310785}     Latest Ref Rng & Units 02/15/2023    2:51 PM 02/03/2022   10:55 AM 06/14/2021    9:06 AM  CMP  Glucose 70 - 99 mg/dL 409  811  914   BUN 6 - 24 mg/dL 19  17  16    Creatinine 0.76 - 1.27 mg/dL 7.82  9.56  2.13   Sodium 134 - 144 mmol/L 139  139  136   Potassium 3.5 - 5.2 mmol/L 4.3  4.7  4.0   Chloride 96 - 106 mmol/L 95  99  101   CO2 20 - 29 mmol/L 23  25  26    Calcium 8.7 - 10.2 mg/dL 9.6  08.6  9.4    Lipid Panel     Component Value Date/Time   CHOL 194 07/13/2020 1451   TRIG 187 (H) 07/13/2020 1451   HDL 43 07/13/2020 1451   CHOLHDL 4.5 07/13/2020 1451   LDLCALC 121 (H) 07/13/2020 1451    CBC    Component Value Date/Time   WBC 9.5 07/13/2020 1451   RBC 4.82 07/13/2020 1451   HGB 15.0 07/13/2020 1451   HCT 44.6 07/13/2020 1451   PLT 250 07/13/2020 1451   MCV 92.5 07/13/2020 1451   MCH 31.1 07/13/2020 1451   MCHC 33.6 07/13/2020 1451   RDW 12.1 07/13/2020 1451   LYMPHSABS 1.5 02/16/2017 1932   MONOABS 0.9 02/16/2017 1932   EOSABS 0.2 02/16/2017 1932   BASOSABS 0.0 02/16/2017 1932    ASSESSMENT AND PLAN:  There are no diagnoses linked to this encounter.   Patient was given the opportunity to ask questions.  Patient verbalized understanding of the plan and was able to repeat key elements of the plan. Patient was given clear instructions to go to Emergency Department or return to medical center if symptoms don't improve,  worsen, or new problems develop.The patient verbalized understanding.   No orders of the defined types were placed in this encounter.    Requested Prescriptions    No prescriptions requested or ordered in this encounter    No follow-ups on file.  Rema Fendt, NP

## 2023-05-16 ENCOUNTER — Other Ambulatory Visit: Payer: Self-pay | Admitting: Family

## 2023-05-16 ENCOUNTER — Ambulatory Visit: Payer: BC Managed Care – PPO | Admitting: Family

## 2023-05-16 DIAGNOSIS — E119 Type 2 diabetes mellitus without complications: Secondary | ICD-10-CM

## 2023-05-16 DIAGNOSIS — I10 Essential (primary) hypertension: Secondary | ICD-10-CM

## 2023-05-16 DIAGNOSIS — E039 Hypothyroidism, unspecified: Secondary | ICD-10-CM

## 2023-05-16 MED ORDER — SEMAGLUTIDE (1 MG/DOSE) 4 MG/3ML ~~LOC~~ SOPN
0.2500 mg | PEN_INJECTOR | SUBCUTANEOUS | 0 refills | Status: DC
Start: 2023-05-16 — End: 2023-05-17

## 2023-05-16 NOTE — Telephone Encounter (Signed)
Ozempic prescribed. Please schedule appointment with primary provider.

## 2023-05-17 ENCOUNTER — Other Ambulatory Visit: Payer: Self-pay | Admitting: Family

## 2023-05-17 DIAGNOSIS — E119 Type 2 diabetes mellitus without complications: Secondary | ICD-10-CM

## 2023-05-17 MED ORDER — SEMAGLUTIDE(0.25 OR 0.5MG/DOS) 2 MG/3ML ~~LOC~~ SOPN: 0.2500 mg | PEN_INJECTOR | SUBCUTANEOUS | 0 refills | Status: DC

## 2023-05-17 NOTE — Telephone Encounter (Signed)
Ozempic prescribed. Please schedule appointment with primary provider.

## 2023-05-17 NOTE — Telephone Encounter (Signed)
New order for Ozempic 0.25 mg weekly injection prescribed.

## 2023-05-29 ENCOUNTER — Other Ambulatory Visit: Payer: Self-pay | Admitting: Family

## 2023-05-29 DIAGNOSIS — E119 Type 2 diabetes mellitus without complications: Secondary | ICD-10-CM

## 2023-05-29 MED ORDER — SEMAGLUTIDE(0.25 OR 0.5MG/DOS) 2 MG/3ML ~~LOC~~ SOPN
0.2500 mg | PEN_INJECTOR | SUBCUTANEOUS | 1 refills | Status: DC
Start: 2023-05-29 — End: 2023-08-28

## 2023-05-29 NOTE — Telephone Encounter (Signed)
New order for Ozempic 0.25 mg weekly injection prescribed.

## 2023-06-16 ENCOUNTER — Other Ambulatory Visit: Payer: Self-pay | Admitting: Family

## 2023-06-16 DIAGNOSIS — I10 Essential (primary) hypertension: Secondary | ICD-10-CM

## 2023-06-20 DIAGNOSIS — M25562 Pain in left knee: Secondary | ICD-10-CM | POA: Diagnosis not present

## 2023-06-20 DIAGNOSIS — M1712 Unilateral primary osteoarthritis, left knee: Secondary | ICD-10-CM | POA: Diagnosis not present

## 2023-07-29 ENCOUNTER — Other Ambulatory Visit: Payer: Self-pay | Admitting: Family

## 2023-07-29 DIAGNOSIS — E119 Type 2 diabetes mellitus without complications: Secondary | ICD-10-CM

## 2023-07-29 DIAGNOSIS — I10 Essential (primary) hypertension: Secondary | ICD-10-CM

## 2023-07-31 NOTE — Telephone Encounter (Signed)
Complete

## 2023-08-14 DIAGNOSIS — F3181 Bipolar II disorder: Secondary | ICD-10-CM | POA: Diagnosis not present

## 2023-08-26 ENCOUNTER — Other Ambulatory Visit: Payer: Self-pay | Admitting: Family

## 2023-08-26 DIAGNOSIS — E119 Type 2 diabetes mellitus without complications: Secondary | ICD-10-CM

## 2023-08-28 DIAGNOSIS — F3181 Bipolar II disorder: Secondary | ICD-10-CM | POA: Diagnosis not present

## 2023-08-28 NOTE — Telephone Encounter (Signed)
 Complete. Schedule appointment.

## 2023-09-11 DIAGNOSIS — F3181 Bipolar II disorder: Secondary | ICD-10-CM | POA: Diagnosis not present

## 2023-09-12 ENCOUNTER — Other Ambulatory Visit: Payer: Self-pay | Admitting: Family

## 2023-09-12 DIAGNOSIS — N529 Male erectile dysfunction, unspecified: Secondary | ICD-10-CM

## 2023-09-12 DIAGNOSIS — I10 Essential (primary) hypertension: Secondary | ICD-10-CM

## 2023-09-13 NOTE — Telephone Encounter (Signed)
Complete

## 2023-09-25 DIAGNOSIS — F3181 Bipolar II disorder: Secondary | ICD-10-CM | POA: Diagnosis not present

## 2023-10-23 DIAGNOSIS — F3181 Bipolar II disorder: Secondary | ICD-10-CM | POA: Diagnosis not present

## 2023-11-03 ENCOUNTER — Other Ambulatory Visit: Payer: Self-pay | Admitting: Family

## 2023-11-03 DIAGNOSIS — E119 Type 2 diabetes mellitus without complications: Secondary | ICD-10-CM

## 2023-11-20 DIAGNOSIS — F3181 Bipolar II disorder: Secondary | ICD-10-CM | POA: Diagnosis not present

## 2023-11-24 ENCOUNTER — Other Ambulatory Visit: Payer: Self-pay | Admitting: Family

## 2023-11-24 DIAGNOSIS — E119 Type 2 diabetes mellitus without complications: Secondary | ICD-10-CM

## 2023-11-24 DIAGNOSIS — I10 Essential (primary) hypertension: Secondary | ICD-10-CM

## 2023-11-24 NOTE — Telephone Encounter (Signed)
 Complete. Schedule appointment.

## 2023-12-08 ENCOUNTER — Ambulatory Visit (INDEPENDENT_AMBULATORY_CARE_PROVIDER_SITE_OTHER): Payer: BC Managed Care – PPO | Admitting: Family

## 2023-12-08 ENCOUNTER — Encounter: Payer: Self-pay | Admitting: Family

## 2023-12-08 VITALS — BP 122/78 | HR 88 | Temp 98.1°F | Ht 70.0 in | Wt 376.4 lb

## 2023-12-08 DIAGNOSIS — E039 Hypothyroidism, unspecified: Secondary | ICD-10-CM | POA: Diagnosis not present

## 2023-12-08 DIAGNOSIS — Z130 Encounter for screening for diseases of the blood and blood-forming organs and certain disorders involving the immune mechanism: Secondary | ICD-10-CM | POA: Diagnosis not present

## 2023-12-08 DIAGNOSIS — Z13220 Encounter for screening for lipoid disorders: Secondary | ICD-10-CM

## 2023-12-08 DIAGNOSIS — E1165 Type 2 diabetes mellitus with hyperglycemia: Secondary | ICD-10-CM

## 2023-12-08 DIAGNOSIS — Z0000 Encounter for general adult medical examination without abnormal findings: Secondary | ICD-10-CM

## 2023-12-08 DIAGNOSIS — Z7984 Long term (current) use of oral hypoglycemic drugs: Secondary | ICD-10-CM

## 2023-12-08 DIAGNOSIS — Z13228 Encounter for screening for other metabolic disorders: Secondary | ICD-10-CM | POA: Diagnosis not present

## 2023-12-08 DIAGNOSIS — E119 Type 2 diabetes mellitus without complications: Secondary | ICD-10-CM

## 2023-12-08 DIAGNOSIS — Z1211 Encounter for screening for malignant neoplasm of colon: Secondary | ICD-10-CM

## 2023-12-08 DIAGNOSIS — I10 Essential (primary) hypertension: Secondary | ICD-10-CM

## 2023-12-08 MED ORDER — HYDROCHLOROTHIAZIDE 25 MG PO TABS
25.0000 mg | ORAL_TABLET | Freq: Every day | ORAL | 0 refills | Status: DC
Start: 2023-12-08 — End: 2024-03-12

## 2023-12-08 MED ORDER — AMLODIPINE BESYLATE 5 MG PO TABS: 5.0000 mg | ORAL_TABLET | Freq: Every day | ORAL | 0 refills | Status: DC

## 2023-12-08 MED ORDER — OZEMPIC (0.25 OR 0.5 MG/DOSE) 2 MG/3ML ~~LOC~~ SOPN: 0.2500 mg | PEN_INJECTOR | SUBCUTANEOUS | 0 refills | Status: DC

## 2023-12-08 MED ORDER — METFORMIN HCL 500 MG PO TABS: 1000.0000 mg | ORAL_TABLET | Freq: Two times a day (BID) | ORAL | 0 refills | Status: DC

## 2023-12-08 NOTE — Progress Notes (Signed)
Patient states no concerns to discuss

## 2023-12-08 NOTE — Progress Notes (Signed)
Patient ID: Vincent Ruiz, male    DOB: [birth year]  MRN: [id number]  CC: Annual Exam  Subjective: Vincent Ruiz is a 52 y.o. male who presents for annual exam.   His concerns today include:  - Doing well on Amlodipine and Hydrochlorothiazide, no issues/concerns. He does not complain of red flag symptoms such as but not limited to chest pain, shortness of breath, worst headache of life, nausea/vomiting.  - Doing well on Metformin and Semaglutide, no issues/concerns. States he plans to schedule appointment with eye doctor soon. Denies red flag symptoms associated with diabetes/.   Patient Active Problem List   Diagnosis Date Noted   Bipolar 2 disorder, major depressive episode (HCC) 07/03/2016   Hypertension 07/02/2016   Morbid obesity (HCC) 07/02/2016   Type 2 diabetes mellitus (HCC) 07/02/2016   Hypothyroidism 07/02/2016   Sleep apnea on CPAP 07/02/2016     Current Outpatient Medications on File Prior to Visit  Medication Sig Dispense Refill   lamoTRIgine (LAMICTAL) 150 MG tablet Take 150 mg by mouth daily.     omeprazole (PRILOSEC) 20 MG capsule Take 1 capsule (20 mg total) by mouth 2 (two) times daily before a meal. 60 capsule 1   sildenafil (VIAGRA) 100 MG tablet TAKE 1 TABLET 1/2 HOUR TO 1 HOUR PRIOR TO INTERCOURSE AS NEEDED. LIMIT USE TO 1/2 TABLET OR 1 TABLET PER 24 HOURS. 30 tablet 2   sucralfate (CARAFATE) 1 g tablet Take 1 tablet (1 g total) by mouth 2 (two) times daily. 60 tablet 0   Cholecalciferol (VITAMIN D3) 10000 units capsule Take 10,000 Units by mouth daily. (Patient not taking: Reported on 12/08/2023)     No current facility-administered medications on file prior to visit.    Allergies  Allergen Reactions   Penicillins Anaphylaxis    Has patient had a PCN reaction causing immediate rash, facial/tongue/throat swelling, SOB or lightheadedness with hypotension: Yes Has patient had a PCN reaction causing severe rash involving mucus membranes or skin  necrosis: Yes Has patient had a PCN reaction that required hospitalization Yes Has patient had a PCN reaction occurring within the last 10 years: No If all of the above answers are "NO", then may proceed with Cephalosporin use.     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   Social Drivers of Health   Financial Resource Strain: High Risk (12/04/2023)   Overall Financial Resource Strain (CARDIA)    Difficulty of Paying Living Expenses: Very hard  Food Insecurity: Food Insecurity Present (12/04/2023)   Hunger Vital Sign    Worried About Running Out of Food in the Last Year: Sometimes true    Ran Out of Food in the Last Year: Sometimes true  Transportation Needs: Unmet Transportation Needs (12/04/2023)   PRAPARE - Transportation    Lack of  Transportation (Medical): Yes    Lack of Transportation (Non-Medical): Yes  Physical Activity: Insufficiently Active (12/04/2023)   Exercise Vital Sign    Days of Exercise per Week: 2 days    Minutes of Exercise per Session: 20 min  Stress: Stress Concern Present (12/04/2023)   [employer] of Occupational Health - Occupational Stress Questionnaire    Feeling of Stress : To some extent  Social Connections: Unknown (12/04/2023)   Social Connection and Isolation Panel [NHANES]    Frequency of Communication with Friends and Family: Patient declined    Frequency of  Social Gatherings with Friends and Family: Never    Attends Religious Services: Never    Diplomatic Services operational officer: Patient declined    Attends Engineer, structural: Not on file    Marital Status: Separated  Intimate Partner Violence: Not on file    Family History  Problem Relation Age of Onset   Diabetes Mother    Hypertension Mother    Multiple myeloma Sister     Past Surgical History:  Procedure Laterality Date   KNEE SURGERY Left 2005    ROS: Review of Systems Negative except as stated above  PHYSICAL EXAM: BP 122/78   Pulse 88   Temp 98.1 F (36.7 C) (Oral)   Ht 5\' 10"  (1.778 m)   Wt (!) 376 lb 6.4 oz (170.7 kg)   SpO2 90%   BMI 54.01 kg/m   Physical Exam HENT:     Head: Normocephalic and atraumatic.     Right Ear: Tympanic membrane, ear canal and external ear normal.     Left Ear: Tympanic membrane, ear canal and external ear normal.     Nose: Nose normal.     Mouth/Throat:     Mouth: Mucous membranes are moist.     Pharynx: Oropharynx is clear.  Eyes:     Extraocular Movements: Extraocular movements intact.     Conjunctiva/sclera: Conjunctivae normal.     Pupils: Pupils are equal, round, and reactive to light.  Neck:     Thyroid: No thyroid mass, thyromegaly or thyroid tenderness.  Cardiovascular:     Rate and Rhythm: Normal rate and regular rhythm.     Pulses: Normal pulses.     Heart sounds: Normal heart sounds.  Pulmonary:     Effort: Pulmonary effort is normal.     Breath sounds: Normal breath sounds.  Abdominal:     General: Bowel sounds are normal.     Palpations: Abdomen is soft.  Genitourinary:    Comments: Patient declined.  Musculoskeletal:        General: Normal range of motion.     Right shoulder: Normal.     Left shoulder: Normal.     Right upper arm: Normal.     Left upper arm: Normal.     Right elbow: Normal.     Left elbow: Normal.     Right forearm: Normal.     Left forearm: Normal.     Right wrist:  Normal.     Left wrist: Normal.     Right hand: Normal.     Left hand: Normal.     Cervical back: Normal, normal range of motion and neck supple.     Thoracic back: Normal.     Lumbar back: Normal.     Right hip: Normal.     Left hip: Normal.     Right upper leg: Normal.     Left upper leg: Normal.  Right knee: Normal.     Left knee: Normal.     Right lower leg: Normal.     Left lower leg: Normal.     Right ankle: Normal.     Left ankle: Normal.     Right foot: Normal.     Left foot: Normal.  Skin:    General: Skin is warm and dry.     Capillary Refill: Capillary refill takes less than 2 seconds.  Neurological:     General: No focal deficit present.     Mental Status: He is alert and oriented to person, place, and time.  Psychiatric:        Mood and Affect: Mood normal.        Behavior: Behavior normal.     ASSESSMENT AND PLAN: 1. Annual physical exam (Primary) - Counseled on 150 minutes of exercise per week as tolerated, healthy eating (including decreased daily intake of saturated fats, cholesterol, added sugars, sodium), STI prevention, and routine healthcare maintenance.  2. Screening for metabolic disorder - Routine screening.  - CMP14+EGFR  3. Screening for deficiency anemia - Routine screening.  - CBC  4. Screening cholesterol level - Routine screening.  - Lipid panel  5. Colon cancer screening - Referral to Gastroenterology for colon cancer screening by colonoscopy. - Ambulatory referral to Gastroenterology  6. Primary hypertension - Continue Amlodipine and Hydrochlorothiazide as prescribed.  - Counseled on blood pressure goal of less than 130/80, low-sodium, DASH diet, medication compliance, and 150 minutes of moderate intensity exercise per week as tolerated. Counseled on medication adherence and adverse effects. - Follow-up with primary provider in 3 months or sooner if needed.  - amLODipine (NORVASC) 5 MG tablet; Take 1 tablet (5 mg total) by  mouth daily.  Dispense: 90 tablet; Refill: 0 - hydrochlorothiazide (HYDRODIURIL) 25 MG tablet; Take 1 tablet (25 mg total) by mouth daily.  Dispense: 90 tablet; Refill: 0  7. Type 2 diabetes mellitus with hyperglycemia, without long-term current use of insulin (HCC) - Continue Metformin and Semaglutide as prescribed. Counseled on medication adherence/adverse effects.  - Hemoglobin A1c result pending.  - Routine screening.  - Discussed the importance of healthy eating habits, low-carbohydrate diet, low-sugar diet, regular aerobic exercise (at least 150 minutes a week as tolerated) and medication compliance to achieve or maintain control of diabetes. - Follow-up with primary provider as scheduled.  - metFORMIN (GLUCOPHAGE) 500 MG tablet; Take 2 tablets (1,000 mg total) by mouth 2 (two) times daily with a meal.  Dispense: 360 tablet; Refill: 0 - Semaglutide,0.25 or 0.5MG /DOS, (OZEMPIC, 0.25 OR 0.5 MG/DOSE,) 2 MG/3ML SOPN; Inject 0.25 mg as directed once a week.  Dispense: 9 mL; Refill: 0 - Microalbumin / creatinine urine ratio - Hemoglobin A1c  8. Encounter for diabetic foot exam [facility]) - Referral to Podiatry for evaluation/management.  - Ambulatory referral to Podiatry  9. Hypothyroidism, unspecified type - Routine screening.  - TSH   Patient was given the opportunity to ask questions.  Patient verbalized understanding of the plan and was able to repeat key elements of the plan. Patient was given clear instructions to go to Emergency Department or return to medical center if symptoms don't improve, worsen, or new problems develop.The patient verbalized understanding.   Orders Placed This Encounter  Procedures   Microalbumin / creatinine urine ratio   CBC   Lipid panel   CMP14+EGFR   Hemoglobin A1c   TSH   Ambulatory referral to Podiatry   Ambulatory referral to Gastroenterology  Requested Prescriptions   Signed Prescriptions Disp Refills   amLODipine (NORVASC) 5 MG tablet 90  tablet 0    Sig: Take 1 tablet (5 mg total) by mouth daily.   hydrochlorothiazide (HYDRODIURIL) 25 MG tablet 90 tablet 0    Sig: Take 1 tablet (25 mg total) by mouth daily.   metFORMIN (GLUCOPHAGE) 500 MG tablet 360 tablet 0    Sig: Take 2 tablets (1,000 mg total) by mouth 2 (two) times daily with a meal.   Semaglutide,0.25 or 0.5MG /DOS, (OZEMPIC, 0.25 OR 0.5 MG/DOSE,) 2 MG/3ML SOPN 9 mL 0    Sig: Inject 0.25 mg as directed once a week.    Return in about 1 year (around 12/07/2024) for Physical per patient preference and 3 months chronic conditions .  Rema Fendt, NP

## 2023-12-09 LAB — CBC
Hematocrit: 45.8 % (ref 37.5–51.0)
Hemoglobin: 15.3 g/dL (ref 13.0–17.7)
MCH: 31.7 pg (ref 26.6–33.0)
MCHC: 33.4 g/dL (ref 31.5–35.7)
MCV: 95 fL (ref 79–97)
Platelets: 285 10*3/uL (ref 150–450)
RBC: 4.83 x10E6/uL (ref 4.14–5.80)
RDW: 12.4 % (ref 11.6–15.4)
WBC: 9.2 10*3/uL (ref 3.4–10.8)

## 2023-12-09 LAB — CMP14+EGFR
ALT: 19 [IU]/L (ref 0–44)
AST: 23 [IU]/L (ref 0–40)
Albumin: 4 g/dL — ABNORMAL LOW (ref 4.1–5.1)
Alkaline Phosphatase: 57 [IU]/L (ref 44–121)
BUN/Creatinine Ratio: 15 (ref 9–20)
BUN: 21 mg/dL (ref 6–24)
Bilirubin Total: 0.8 mg/dL (ref 0.0–1.2)
CO2: 23 mmol/L (ref 20–29)
Calcium: 9.6 mg/dL (ref 8.7–10.2)
Chloride: 100 mmol/L (ref 96–106)
Creatinine, Ser: 1.39 mg/dL — ABNORMAL HIGH (ref 0.76–1.27)
Globulin, Total: 3.3 g/dL (ref 1.5–4.5)
Glucose: 108 mg/dL — ABNORMAL HIGH (ref 70–99)
Potassium: 4 mmol/L (ref 3.5–5.2)
Sodium: 138 mmol/L (ref 134–144)
Total Protein: 7.3 g/dL (ref 6.0–8.5)
eGFR: 62 mL/min/{1.73_m2} (ref >59)

## 2023-12-09 LAB — HEMOGLOBIN A1C
Est. average glucose Bld gHb Est-mCnc: 189 mg/dL
Hgb A1c MFr Bld: 8.2 % — ABNORMAL HIGH (ref 4.8–5.6)

## 2023-12-09 LAB — LIPID PANEL
Chol/HDL Ratio: 4.2 {ratio} (ref 0.0–5.0)
Cholesterol, Total: 168 mg/dL (ref 100–199)
HDL: 40 mg/dL (ref >39)
LDL Chol Calc (NIH): 109 mg/dL — ABNORMAL HIGH (ref 0–99)
Triglycerides: 104 mg/dL (ref 0–149)
VLDL Cholesterol Cal: 19 mg/dL (ref 5–40)

## 2023-12-09 LAB — TSH: TSH: 3.22 u[IU]/mL (ref 0.450–4.500)

## 2023-12-11 ENCOUNTER — Other Ambulatory Visit: Payer: Self-pay | Admitting: Family

## 2023-12-11 DIAGNOSIS — E785 Hyperlipidemia, unspecified: Secondary | ICD-10-CM

## 2023-12-11 DIAGNOSIS — E1165 Type 2 diabetes mellitus with hyperglycemia: Secondary | ICD-10-CM

## 2023-12-11 MED ORDER — OZEMPIC (0.25 OR 0.5 MG/DOSE) 2 MG/3ML ~~LOC~~ SOPN: 0.5000 mg | PEN_INJECTOR | SUBCUTANEOUS | 0 refills | Status: DC

## 2023-12-11 MED ORDER — ATORVASTATIN CALCIUM 20 MG PO TABS: 20.0000 mg | ORAL_TABLET | Freq: Every day | ORAL | 0 refills | Status: DC

## 2023-12-18 DIAGNOSIS — F3181 Bipolar II disorder: Secondary | ICD-10-CM | POA: Diagnosis not present

## 2023-12-22 DIAGNOSIS — M79672 Pain in left foot: Secondary | ICD-10-CM | POA: Diagnosis not present

## 2024-01-12 ENCOUNTER — Other Ambulatory Visit: Payer: Self-pay | Admitting: Family Medicine

## 2024-01-12 DIAGNOSIS — M79671 Pain in right foot: Secondary | ICD-10-CM | POA: Diagnosis not present

## 2024-01-12 DIAGNOSIS — M79672 Pain in left foot: Secondary | ICD-10-CM | POA: Diagnosis not present

## 2024-01-15 DIAGNOSIS — F3181 Bipolar II disorder: Secondary | ICD-10-CM | POA: Diagnosis not present

## 2024-01-18 ENCOUNTER — Ambulatory Visit
Admission: RE | Admit: 2024-01-18 | Discharge: 2024-01-18 | Disposition: A | Discharge status: Home / Self Care | Payer: BC Managed Care – PPO | Source: Ambulatory Visit | Attending: Family Medicine | Admitting: Family Medicine

## 2024-01-18 DIAGNOSIS — M79672 Pain in left foot: Secondary | ICD-10-CM

## 2024-01-22 DIAGNOSIS — M79671 Pain in right foot: Secondary | ICD-10-CM | POA: Diagnosis not present

## 2024-02-12 ENCOUNTER — Other Ambulatory Visit: Payer: Self-pay | Admitting: Family

## 2024-02-12 DIAGNOSIS — F3181 Bipolar II disorder: Secondary | ICD-10-CM | POA: Diagnosis not present

## 2024-02-12 DIAGNOSIS — I10 Essential (primary) hypertension: Secondary | ICD-10-CM

## 2024-02-22 ENCOUNTER — Other Ambulatory Visit: Payer: Self-pay | Admitting: Family

## 2024-02-22 DIAGNOSIS — I10 Essential (primary) hypertension: Secondary | ICD-10-CM

## 2024-03-11 ENCOUNTER — Ambulatory Visit: Payer: BC Managed Care – PPO | Admitting: Family

## 2024-03-11 DIAGNOSIS — F3181 Bipolar II disorder: Secondary | ICD-10-CM | POA: Diagnosis not present

## 2024-03-12 ENCOUNTER — Other Ambulatory Visit: Payer: Self-pay

## 2024-03-12 ENCOUNTER — Other Ambulatory Visit: Payer: Self-pay | Admitting: Family

## 2024-03-12 DIAGNOSIS — I10 Essential (primary) hypertension: Secondary | ICD-10-CM

## 2024-03-12 MED ORDER — HYDROCHLOROTHIAZIDE 25 MG PO TABS: 25.0000 mg | ORAL_TABLET | Freq: Every day | ORAL | 0 refills | Status: DC

## 2024-03-19 ENCOUNTER — Ambulatory Visit (INDEPENDENT_AMBULATORY_CARE_PROVIDER_SITE_OTHER): Admitting: Bariatrics

## 2024-03-19 ENCOUNTER — Encounter: Payer: Self-pay | Admitting: Bariatrics

## 2024-03-19 VITALS — BP 168/85 | HR 89 | Temp 97.8°F | Ht 69.0 in | Wt 386.0 lb

## 2024-03-19 DIAGNOSIS — Z6841 Body Mass Index (BMI) 40.0 and over, adult: Secondary | ICD-10-CM | POA: Diagnosis not present

## 2024-03-19 DIAGNOSIS — Z0289 Encounter for other administrative examinations: Secondary | ICD-10-CM

## 2024-03-19 DIAGNOSIS — E1165 Type 2 diabetes mellitus with hyperglycemia: Secondary | ICD-10-CM

## 2024-03-19 DIAGNOSIS — I10 Essential (primary) hypertension: Secondary | ICD-10-CM

## 2024-03-19 DIAGNOSIS — E119 Type 2 diabetes mellitus without complications: Secondary | ICD-10-CM | POA: Diagnosis not present

## 2024-03-19 DIAGNOSIS — Z7984 Long term (current) use of oral hypoglycemic drugs: Secondary | ICD-10-CM

## 2024-03-19 NOTE — Progress Notes (Signed)
 Office: [phone]  /  Fax: [phone]   Initial Visit  Vincent Ruiz was seen in clinic today to evaluate for obesity. He is interested in losing weight to improve overall health and reduce the risk of weight related complications. He presents today to review program treatment options, initial physical assessment, and evaluation.     He was referred by: Friend or Family  When asked what else they would like to accomplish? He states: Adopt healthier eating patterns, Improve energy levels and physical activity, Improve existing medical conditions, and Improve quality of life  When asked how has your weight affected you? He states: Contributed to medical problems, Contributed to orthopedic problems or mobility issues, and Having poor endurance  Some associated conditions: Hypertension, Hyperlipidemia, and Diabetes  Contributing factors: Family history of obesity and Reduced physical activity  Weight promoting medications identified: None  Current nutrition plan: Low-carb, High-protein, and Portion control / smart choices  Current level of physical activity: Step counting 5,000 to 7,000  Current or previous pharmacotherapy: Is interested in pharmacotherapy and GLP-1  Response to medication: Lost weight initially but was unable to sustain weight loss   Past medical history includes:   Past Medical History:  Diagnosis Date   Anxiety    Diabetes mellitus without complication (HCC)    Hypertension    Sleep apnea      Objective:   BP (!) 168/85   Pulse 89   Temp 97.8 F (36.6 C)   Ht 5\' 9"  (1.753 m)   Wt (!) 386 lb (175.1 kg)   SpO2 95%   BMI 57.00 kg/m  He was weighed on the bioimpedance scale: Body mass index is 57 kg/m.  Peak Weight:445 lbs , Body Fat%:50.5 %, Visceral Fat Rating:40, Weight trend over the last 12 months: Decreasing  General:  Alert, oriented and cooperative. Patient is in no acute distress.  Respiratory: Normal respiratory effort, no problems  with respiration noted  Extremities: Normal range of motion.    Mental Status: Normal mood and affect. Normal behavior. Normal judgment and thought content.   DIAGNOSTIC DATA REVIEWED:  BMET    Component Value Date/Time   NA 138 12/08/2023 1425   K 4.0 12/08/2023 1425   CL 100 12/08/2023 1425   CO2 23 12/08/2023 1425   GLUCOSE 108 (H) 12/08/2023 1425   GLUCOSE 118 (H) 06/14/2021 0906   BUN 21 12/08/2023 1425   CREATININE 1.39 (H) 12/08/2023 1425   CREATININE 1.10 06/14/2021 0906   CALCIUM 9.6 12/08/2023 1425   GFRNONAA 83 03/08/2021 0846   GFRAA 96 03/08/2021 0846   Lab Results  Component Value Date   HGBA1C 8.2 (H) 12/08/2023   HGBA1C 11.9 (H) 07/13/2020   No results found for: "INSULIN" CBC    Component Value Date/Time   WBC 9.2 12/08/2023 1425   WBC 9.5 07/13/2020 1451   RBC 4.83 12/08/2023 1425   RBC 4.82 07/13/2020 1451   HGB 15.3 12/08/2023 1425   HCT 45.8 12/08/2023 1425   PLT 285 12/08/2023 1425   MCV 95 12/08/2023 1425   MCH 31.7 12/08/2023 1425   MCH 31.1 07/13/2020 1451   MCHC 33.4 12/08/2023 1425   MCHC 33.6 07/13/2020 1451   RDW 12.4 12/08/2023 1425   Iron/TIBC/Ferritin/ %Sat No results found for: "IRON", "TIBC", "FERRITIN", "IRONPCTSAT" Lipid Panel     Component Value Date/Time   CHOL 168 12/08/2023 1425   TRIG 104 12/08/2023 1425   HDL 40 12/08/2023 1425   CHOLHDL 4.2 12/08/2023 1425  CHOLHDL 4.5 07/13/2020 1451   LDLCALC 109 (H) 12/08/2023 1425   LDLCALC 121 (H) 07/13/2020 1451   Hepatic Function Panel     Component Value Date/Time   PROT 7.3 12/08/2023 1425   ALBUMIN 4.0 (L) 12/08/2023 1425   AST 23 12/08/2023 1425   ALT 19 12/08/2023 1425   ALKPHOS 57 12/08/2023 1425   BILITOT 0.8 12/08/2023 1425      Component Value Date/Time   TSH 3.220 12/08/2023 1425     Assessment and Plan:   Type II Diabetes HgbA1c is not at goal. Last A1c was 8.2 CBGs: Not checking      Episodes of hypoglycemia: no Medication(s): Metformin 500  mg ( 2 tablets), Bid   Lab Results  Component Value Date   HGBA1C 8.2 (H) 12/08/2023   HGBA1C 9.2 (A) 02/15/2023   HGBA1C 7.1 (H) 05/20/2022   Lab Results  Component Value Date   LDLCALC 109 (H) 12/08/2023   CREATININE 1.39 (H) 12/08/2023   No results found for: "GFR"  Plan: Continue Metformin 500 mg Bid.  Continue all other medications.  Will keep all carbohydrates low both sweets and starches.  Will continue exercise regimen to 30 to 60 minutes on most days of the week.  Referral to [facility].   Hypertension Hypertension control uncertain.  Medication(s): Amlodipine 5 mg 1 daily  and Hydrochlorothiazide 25 mg daily   BP Readings from Last 3 Encounters:  03/19/24 (!) 168/85  12/08/23 122/78  04/10/23 131/85   Lab Results  Component Value Date   CREATININE 1.39 (H) 12/08/2023   CREATININE 1.19 02/15/2023   CREATININE 1.34 (H) 02/03/2022   No results found for: "GFR"  Plan: Continue all antihypertensives at current dosages. No added salt. Will keep sodium content to 1,500 mg or less per day.     Morbid Obesity: Current BMI 57.00    Obesity Treatment / Action Plan:  Patient will work on garnering support from family and friends to begin weight loss journey. Will work on eliminating or reducing the presence of highly palatable, calorie dense foods in the home. Will complete provided nutritional and psychosocial assessment questionnaire before the next appointment. Will be scheduled for indirect calorimetry to determine resting energy expenditure in a fasting state.  This will allow [place] to create a reduced calorie, high-protein meal plan to promote loss of fat mass while preserving muscle mass. Counseled on the health benefits of losing 5%-15% of total body weight. Was counseled on nutritional approaches to weight loss and benefits of reducing processed foods and consuming plant-based foods and high quality protein as part of nutritional weight management. Was  counseled on pharmacotherapy and role as an adjunct in weight management.   Obesity Education Performed Today:  He was weighed on the bioimpedance scale and results were discussed and documented in the synopsis.  We discussed obesity as a disease and the importance of a more detailed evaluation of all the factors contributing to the disease.  We discussed the importance of long term lifestyle changes which include nutrition, exercise and behavioral modifications as well as the importance of customizing this to his specific health and social needs.  We discussed the benefits of reaching a healthier weight to alleviate the symptoms of existing conditions and reduce the risks of the biomechanical, metabolic and psychological effects of obesity.  Discussed New Patient/Late Arrival, and Cancellation Policies. Patient voiced understanding and allowed to ask questions.   Vincent Ruiz appears to be in the action stage of  change and states they are ready to start intensive lifestyle modifications and behavioral modifications.  30 minutes was spent today on this visit including the above counseling, pre-visit chart review, and post-visit documentation.  Reviewed by clinician on day of visit: allergies, medications, problem list, medical history, surgical history, family history, social history, and previous encounter notes.    Loleta Frommelt A. Lorretta HarpO.

## 2024-03-19 NOTE — Addendum Note (Signed)
 Addended byIva Lento T on: 03/19/2024 02:04 PM   Modules accepted: Orders

## 2024-03-27 ENCOUNTER — Ambulatory Visit: Admitting: Bariatrics

## 2024-03-27 ENCOUNTER — Encounter: Payer: Self-pay | Admitting: Bariatrics

## 2024-03-27 VITALS — BP 162/91 | HR 68 | Temp 97.9°F | Ht 69.0 in | Wt 389.0 lb

## 2024-03-27 DIAGNOSIS — Z6841 Body Mass Index (BMI) 40.0 and over, adult: Secondary | ICD-10-CM | POA: Diagnosis not present

## 2024-03-27 DIAGNOSIS — E119 Type 2 diabetes mellitus without complications: Secondary | ICD-10-CM

## 2024-03-27 DIAGNOSIS — E785 Hyperlipidemia, unspecified: Secondary | ICD-10-CM

## 2024-03-27 DIAGNOSIS — R0602 Shortness of breath: Secondary | ICD-10-CM | POA: Diagnosis not present

## 2024-03-27 DIAGNOSIS — E1165 Type 2 diabetes mellitus with hyperglycemia: Secondary | ICD-10-CM | POA: Diagnosis not present

## 2024-03-27 DIAGNOSIS — I10 Essential (primary) hypertension: Secondary | ICD-10-CM | POA: Diagnosis not present

## 2024-03-27 DIAGNOSIS — R5383 Other fatigue: Secondary | ICD-10-CM | POA: Diagnosis not present

## 2024-03-27 DIAGNOSIS — E66813 Obesity, class 3: Secondary | ICD-10-CM

## 2024-03-27 DIAGNOSIS — E559 Vitamin D deficiency, unspecified: Secondary | ICD-10-CM | POA: Diagnosis not present

## 2024-03-27 DIAGNOSIS — Z1331 Encounter for screening for depression: Secondary | ICD-10-CM | POA: Diagnosis not present

## 2024-03-27 DIAGNOSIS — Z0000 Encounter for general adult medical examination without abnormal findings: Secondary | ICD-10-CM

## 2024-03-27 DIAGNOSIS — Z7984 Long term (current) use of oral hypoglycemic drugs: Secondary | ICD-10-CM

## 2024-03-27 NOTE — Progress Notes (Deleted)
 At a Glance:  No data recorded No data recorded No data recorded No data recorded  EKG: Normal sinus rhythm, rate ***. {BJY:78295}  Indirect Calorimeter:   Resting Metabolic Rate ( RMR):  RMR (actual): RMR (calculated): The calculated basal metabolic rate is *** thus his basal metabolic rate is {DESC; BETTER/WORSE:18575} than expected.  Plan:   Indirect calorimeter completed, interpreted and reviewed with patient today and allowed to ask questions.  Discussed the implications for the chosen plan and exercise based on the RMR reading.  Will consider repeating the RMR in the future based on weight loss.    Chief Complaint:  Obesity   Subjective:  Vincent Ruiz (MR# [id number]) is {MWM Q/IO:962952841} 53 y.o. male who presents for evaluation and treatment of obesity and related comorbidities.   Vincent Ruiz is currently in the action stage of change and ready to dedicate time achieving and maintaining a healthier weight. Vincent Ruiz is interested in becoming our patient and working on intensive lifestyle modifications including (but not limited to) diet and exercise for weight loss.  Vincent Ruiz has been struggling with his weight. He has been unsuccessful in either losing weight, maintaining weight loss, or reaching his healthy weight goal.  Vincent Ruiz's habits were reviewed today and are as follows: {MWM WT HABITS:23461}.  {MWM Started gaining weight (Optional):210964025} {Has dealt with weight issues.Marland Kitchen.(Optional):210964026}  Current or previous pharmacotherapy: {EM previousRx:28311}  Response to medication: {EMResponsetomedication:28312}  Other Fatigue Vincent Ruiz {Actions; denies/reports/admits to:19208} daytime somnolence and {Actions; denies/reports/admits to:19208} waking up still tired. Patient has a history of symptoms of {OSA Sx:17850}. Vincent Ruiz generally gets {numbers (fuzzy):14653} hours of sleep per night, and states that he has {sleep quality:17851}. Snoring {is/are not:32546}  present. Vincent Ruiz {is/are not:32546} present. Epworth Sleepiness Score is ***.   Shortness of Breath Vincent Ruiz notes increasing shortness of breath with exercising and seems to be worsening over time with weight gain. He notes getting out of breath sooner with activity than he used to. This {HAS HAS LKG:40102} gotten worse recently. Carthel denies shortness of breath at rest or orthopnea.  Depression Screen Vincent Ruiz's Food and Mood (modified PHQ-9) score was ***. {Depression scale:31621}     04/10/2023    2:54 PM  Depression screen PHQ 2/9  Decreased Interest 0  Down, Depressed, Hopeless 1  PHQ - 2 Score 1  Altered sleeping 1  Tired, decreased energy 1  Change in appetite 0  Feeling bad or failure about yourself  0  Trouble concentrating 0  Moving slowly or fidgety/restless 0  Suicidal thoughts 0  PHQ-9 Score 3     Assessment and Plan:   Other Fatigue Vincent Ruiz {DOES_DOES VOZ:36644} feel that his weight is causing his energy to be lower than it should be. Fatigue may be related to obesity, depression or many other causes. Labs will be ordered, and in the meanwhile, Vincent Ruiz will focus on self care including making healthy food choices, increasing physical activity and focusing on stress reduction.  Shortness of Breath Vincent Ruiz {DOES_DOES IHK:74259} feel that he gets out of breath more easily that he used to when he exercises. Tayton's shortness of breath appears to be obesity related and exercise induced. He has agreed to work on weight loss and gradually increase exercise to treat his exercise induced shortness of breath. Will continue to monitor closely.  Health Maintenance:   Obesity   Plan: Will do EKG, indirect calorimetry, and labs.     Vitamin D Deficiency Vitamin D {CHL AMB IS/IS NOT:210130109} at goal of 50.  Most  recent vitamin D level was ***. He/She is at risk for vitamin D deficiency due to obesity.  He is on {dwwslvitdrx:29084}. Lab Results  Component Value Date    VD25OH 50 12/08/2020   VD25OH 24 (L) 07/13/2020    Plan: {dwwslrf:29083::"Refill"} prescription vitamin D 50,000 IU weekly.  Will check for vitamin D deficiency.   {Depression Screen Positive:31580}  There are no diagnoses linked to this encounter.  Previous labs reviewed today. Date: *** {Labs:31622}  Labs done today {Labs Today:31623}   {dwwmorbid:29108::"Morbid Obesity"}: No data recorded  Vincent Ruiz {CHL AMB IS/IS NOT:210130109} currently in the action stage of change and his goal is to {MWM weight loss efforts:31624}. I recommend [place] begin the structured treatment plan as follows:  He has agreed to {HWW Weight Loss ZOXW:960454098}  Exercise goals: {MWM Exercise Recommendations:210964029}  Behavioral modification strategies:{HWW Behavior Modification:210964008}  He was informed of the importance of frequent follow-up visits to maximize his success with intensive lifestyle modifications for his multiple health conditions. He was informed we would discuss his lab results at his next visit unless there is a critical issue that needs to be addressed sooner. Truth agreed to keep his next visit at the agreed upon time to discuss these results.  Objective:  General: Cooperative, alert, well developed, in no acute distress. HEENT: Conjunctivae and lids unremarkable. Cardiovascular: Regular rhythm.  Lungs: Normal work of breathing. Neurologic: No focal deficits.   Lab Results  Component Value Date   CREATININE 1.39 (H) 12/08/2023   BUN 21 12/08/2023   NA 138 12/08/2023   K 4.0 12/08/2023   CL 100 12/08/2023   CO2 23 12/08/2023   Lab Results  Component Value Date   ALT 19 12/08/2023   AST 23 12/08/2023   ALKPHOS 57 12/08/2023   BILITOT 0.8 12/08/2023   Lab Results  Component Value Date   HGBA1C 8.2 (H) 12/08/2023   HGBA1C 9.2 (A) 02/15/2023   HGBA1C 7.1 (H) 05/20/2022   HGBA1C 8.4 (A) 02/03/2022   HGBA1C 8.7 (H) 06/14/2021   No results found for:  "INSULIN" Lab Results  Component Value Date   TSH 3.220 12/08/2023   Lab Results  Component Value Date   CHOL 168 12/08/2023   HDL 40 12/08/2023   LDLCALC 109 (H) 12/08/2023   TRIG 104 12/08/2023   CHOLHDL 4.2 12/08/2023   Lab Results  Component Value Date   WBC 9.2 12/08/2023   HGB 15.3 12/08/2023   HCT 45.8 12/08/2023   MCV 95 12/08/2023   PLT 285 12/08/2023   No results found for: "IRON", "TIBC", "FERRITIN"  Attestation Statements:  Applicable history such as the following:  allergies, medications, problem list, medical history, surgical history, family history, social history, and previous encounter notes reviewed by clinician on day of visit:  ***(delete if time-based billing not used) Time spent on visit including the items listed below was *** minutes.  -preparing to see the patient (e.g., review of tests, history, previous notes) -obtaining and/or reviewing separately obtained history -counseling and educating the patient/family/caregiver -documenting clinical information in the electronic or other health record -ordering medications, tests, or procedures -independently interpreting results and communicating results to the patient/ family/caregiver -referring and communicating with other health care professionals  -care coordination   This may have been prepared with the assistance of Engineer, civil (consulting).  Occasional wrong-word or sound-a-like substitutions may have occurred due to the inherent limitations of voice recognition software.    Trisha Mangle, CMA

## 2024-03-27 NOTE — Progress Notes (Signed)
 At a Glance:  Vitals Temp: 97.9 F (36.6 C) BP: (!) 162/91 Pulse Rate: 68 SpO2: 100 %   Anthropometric Measurements Height: 5\' 9"  (1.753 m) Weight: (!) 389 lb (176.4 kg) BMI (Calculated): 57.42 Starting Weight: 389lb   Body Composition  Body Fat %: 50.5 % Fat Mass (lbs): 196.4 lbs Muscle Mass (lbs): 183.2 lbs Visceral Fat Rating : 41   Other Clinical Data Fasting: yes Labs: yes Today's Visit #: 1 Starting Date: 03/27/24    EKG: Normal sinus rhythm, rate 75.  Indirect Calorimeter:   Resting Metabolic Rate ( RMR):  RMR (actual): 2606 kcal RMR (calculated): 2839 kcal The calculated basal metabolic rate is 1610 kcal thus his basal metabolic rate is worse than expected.  Plan:   Indirect calorimeter completed, interpreted and reviewed with patient today and allowed to ask questions.  Discussed the implications for the chosen plan and exercise based on the RMR reading.  Will consider repeating the RMR in the future based on weight loss.    Chief Complaint:  Obesity   Subjective:  Vincent Ruiz (MR# [id number]) is a 53 y.o. male who presents for evaluation and treatment of obesity and related comorbidities.   Vincent Ruiz is currently in the action stage of change and ready to dedicate time achieving and maintaining a healthier weight. Vincent Ruiz is interested in becoming our patient and working on intensive lifestyle modifications including (but not limited to) diet and exercise for weight loss.  Vincent Ruiz has been struggling with his weight. He has been unsuccessful in either losing weight, maintaining weight loss, or reaching his healthy weight goal.  Vincent Ruiz's habits were reviewed today and are as follows: His family eats meals together, he thinks his family will eat healthier with him, he has been heavy most of his life, he skips meals frequently, and he frequently eats larger portions than normal.  Current or previous pharmacotherapy: GLP-1, was taking Ozempic  and Trulicity in the past.   Response to medication: Was cost prohibitive or lost coverage for AOM.  Other Fatigue Vincent Ruiz admits to daytime somnolence and admits to waking up still tired. Patient has a history of symptoms of daytime fatigue. Vincent Ruiz generally gets 5 or 6 hours of sleep per night, and states that he has difficulty falling back asleep if awakened. Snoring is present. Apneic episodes may be present. Epworth Sleepiness Score is 18.   Shortness of Breath Vincent Ruiz notes increasing shortness of breath with exercising and seems to be worsening over time with weight gain. He notes getting out of breath sooner with activity than he used to. This has gotten worse recently. Vincent Ruiz denies shortness of breath at rest or orthopnea.  Depression Screen Vincent Ruiz's Food and Mood (modified PHQ-9) score was 7. 5-9 mild depression     04/10/2023    2:54 PM  Depression screen PHQ 2/9  Decreased Interest 0  Down, Depressed, Hopeless 1  PHQ - 2 Score 1  Altered sleeping 1  Tired, decreased energy 1  Change in appetite 0  Feeling bad or failure about yourself  0  Trouble concentrating 0  Moving slowly or fidgety/restless 0  Suicidal thoughts 0  PHQ-9 Score 3     Assessment and Plan:   Other Fatigue Vincent Ruiz does feel that his weight is causing his energy to be lower than it should be. Fatigue may be related to obesity, depression or many other causes. Labs will be ordered, and in the meanwhile, Vincent Ruiz will focus on self care including making healthy food  choices, increasing physical activity and focusing on stress reduction.  Shortness of Breath Vincent Ruiz does feel that he gets out of breath more easily that he used to when he exercises. Vincent Ruiz's shortness of breath appears to be obesity related and exercise induced. He has agreed to work on weight loss and gradually increase exercise to treat his exercise induced shortness of breath. Will continue to monitor closely.  Health Maintenance:    Obesity   Plan: Will do EKG, indirect calorimetry, and labs.     Vitamin D Deficiency Vitamin D is at goal of 50.  Most recent vitamin D level was 50. He is at risk for vitamin D deficiency due to obesity.  He is on a MV. Lab Results  Component Value Date   VD25OH 50 12/08/2020   VD25OH 24 (L) 07/13/2020    Plan: Will check for vitamin D deficiency.   Type II Diabetes HgbA1c is not at goal. Last A1c was 8.2 Episodes of hypoglycemia: no Medication(s): Metformin 500 mg twice daily with meals  Lab Results  Component Value Date   HGBA1C 8.2 (H) 12/08/2023   HGBA1C 9.2 (A) 02/15/2023   HGBA1C 7.1 (H) 05/20/2022   Lab Results  Component Value Date   LDLCALC 109 (H) 12/08/2023   CREATININE 1.39 (H) 12/08/2023   No results found for: "GFR"  Plan: Continue Metformin 500 mg twice daily with meals Will keep all carbohydrates low both sweets and starches.  Will continue exercise regimen to 30 to 60 minutes on most days of the week.  Aim for 7 to 9 hours of sleep nightly.  Eat more low glycemic index foods.   Hypertension Hypertension control uncertain.  Medication(s): Amlodipine 5 mg 1 daily   BP Readings from Last 3 Encounters:  03/27/24 (!) 162/91  03/19/24 (!) 168/85  12/08/23 122/78   Lab Results  Component Value Date   CREATININE 1.39 (H) 12/08/2023   CREATININE 1.19 02/15/2023   CREATININE 1.34 (H) 02/03/2022   No results found for: "GFR"  Plan: Continue all antihypertensives at current dosages. No added salt. Will keep sodium content to 1,500 mg or less per day.    Hyperlipidemia LDL is not at goal. Medication(s): Lipitor Cardiovascular risk factors: diabetes mellitus, hypertension, male gender, obesity (BMI >= 30 kg/m2), and sedentary lifestyle  Lab Results  Component Value Date   CHOL 168 12/08/2023   HDL 40 12/08/2023   LDLCALC 109 (H) 12/08/2023   TRIG 104 12/08/2023   CHOLHDL 4.2 12/08/2023   Lab Results  Component Value Date   ALT 19  12/08/2023   AST 23 12/08/2023   ALKPHOS 57 12/08/2023   BILITOT 0.8 12/08/2023   The 10-year ASCVD risk score (Arnett DK, et al., 2019) is: 24.4%   Values used to calculate the score:     Age: 21 years     Sex: Male     Is Non-Hispanic African American: Yes     Diabetic: Yes     Tobacco smoker: No     Systolic Blood Pressure: 162 mmHg     Is BP treated: Yes     HDL Cholesterol: 40 mg/dL     Total Cholesterol: 168 mg/dL  Plan:  Continue statin.  Information sheet on healthy vs unhealthy fats.  Will avoid all trans fats.  Will read labels Will minimize saturated fats except the following: low fat meats in moderation, diary, and limited dark chocolate.  Increase Omega 3 in foods, and consider an Omega 3 supplement.  Vincent Ruiz had a positive depression screening. Depression is commonly associated with obesity and often results in emotional eating behaviors. We will monitor this closely and work on CBT to help improve the non-hunger eating patterns. Referral to Psychology may be required if no improvement is seen as he continues in our clinic.    Previous labs reviewed today. Date: 12/08/23 CMP, Lipids, HgbA1c, and CBC, and TSH  Labs done today CMP, Insulin, HgbA1c, Vit D, and Microalbumin   Morbid Obesity: BMI (Calculated): 57.42   Vincent Ruiz is currently in the action stage of change and his goal is to begin weight loss efforts. I recommend Vincent Ruiz begin the structured treatment plan as follows:  He has agreed to Category 4 Plan and keeping a food journal and adhering to recommended goals of 2,000 calories and 120-160 protein  Exercise goals: For substantial health benefits, adults should do at least 150 minutes (2 hours and 30 minutes) a week of moderate-intensity, or 75 minutes (1 hour and 15 minutes) a week of vigorous-intensity aerobic physical activity, or an equivalent combination of moderate- and vigorous-intensity aerobic activity. Aerobic activity should be performed in  episodes of at least 10 minutes, and preferably, it should be spread throughout the week.  Behavioral modification strategies:increasing lean protein intake, increasing vegetables, increase H2O intake, decreasing eating out, no skipping meals, meal planning and cooking strategies, keeping healthy foods in the home, and avoiding temptations  He was informed of the importance of frequent follow-up visits to maximize his success with intensive lifestyle modifications for his multiple health conditions. He was informed we would discuss his lab results at his next visit unless there is a critical issue that needs to be addressed sooner. Vincent Ruiz agreed to keep his next visit at the agreed upon time to discuss these results.  Objective:  General: Cooperative, alert, well developed, in no acute distress. HEENT: Conjunctivae and lids unremarkable. Cardiovascular: Regular rhythm.  Lungs: Normal work of breathing. Neurologic: No focal deficits.   Lab Results  Component Value Date   CREATININE 1.39 (H) 12/08/2023   BUN 21 12/08/2023   NA 138 12/08/2023   K 4.0 12/08/2023   CL 100 12/08/2023   CO2 23 12/08/2023   Lab Results  Component Value Date   ALT 19 12/08/2023   AST 23 12/08/2023   ALKPHOS 57 12/08/2023   BILITOT 0.8 12/08/2023   Lab Results  Component Value Date   HGBA1C 8.2 (H) 12/08/2023   HGBA1C 9.2 (A) 02/15/2023   HGBA1C 7.1 (H) 05/20/2022   HGBA1C 8.4 (A) 02/03/2022   HGBA1C 8.7 (H) 06/14/2021   No results found for: "INSULIN" Lab Results  Component Value Date   TSH 3.220 12/08/2023   Lab Results  Component Value Date   CHOL 168 12/08/2023   HDL 40 12/08/2023   LDLCALC 109 (H) 12/08/2023   TRIG 104 12/08/2023   CHOLHDL 4.2 12/08/2023   Lab Results  Component Value Date   WBC 9.2 12/08/2023   HGB 15.3 12/08/2023   HCT 45.8 12/08/2023   MCV 95 12/08/2023   PLT 285 12/08/2023   No results found for: "IRON", "TIBC", "FERRITIN"  Attestation  Statements:  Applicable history such as the following:  allergies, medications, problem list, medical history, surgical history, family history, social history, and previous encounter notes reviewed by clinician on day of visit:  Time spent on visit including the items listed below was 48 minutes.   I reviewed the labs which were ordered from her visit on 12/08/23.  20 minutes were spent talking about the history, 15 minutes for face to face counseling implementing the plan, discussing the specifics of how to arrange meals, meal planning, water intake.   I spent face to face time discussing his plan, including breakfast, additional breakfast options, lunch, and dinner options, grocery list, and snacks.  I reviewed her indirect calorimetry. I discussed the implications for the diet plan.    Discussed the bio-impedence test (fat %, muscle mass, and water weight) and allowed the patient to ask questions.   This may have been prepared with the assistance of Vincent Ruiz, Vincent Ruiz (consulting).  Occasional wrong-word or sound-a-like substitutions may have occurred due to the inherent limitations of voice recognition software.    Corinna Capra, DO

## 2024-03-28 LAB — CBC WITH DIFFERENTIAL/PLATELET
Basophils Absolute: 0 10*3/uL (ref 0.0–0.2)
Basos: 0 %
EOS (ABSOLUTE): 0.3 10*3/uL (ref 0.0–0.4)
Eos: 3 %
Hematocrit: 46.6 % (ref 37.5–51.0)
Hemoglobin: 15.9 g/dL (ref 13.0–17.7)
Immature Grans (Abs): 0 10*3/uL (ref 0.0–0.1)
Immature Granulocytes: 0 %
Lymphocytes Absolute: 1.5 10*3/uL (ref 0.7–3.1)
Lymphs: 16 %
MCH: 31.9 pg (ref 26.6–33.0)
MCHC: 34.1 g/dL (ref 31.5–35.7)
MCV: 94 fL (ref 79–97)
Monocytes Absolute: 0.9 10*3/uL (ref 0.1–0.9)
Monocytes: 10 %
Neutrophils Absolute: 6.7 10*3/uL (ref 1.4–7.0)
Neutrophils: 71 %
Platelets: 263 10*3/uL (ref 150–450)
RBC: 4.98 x10E6/uL (ref 4.14–5.80)
RDW: 13.1 % (ref 11.6–15.4)
WBC: 9.4 10*3/uL (ref 3.4–10.8)

## 2024-03-28 LAB — COMPREHENSIVE METABOLIC PANEL WITH GFR
ALT: 18 IU/L (ref 0–44)
AST: 25 IU/L (ref 0–40)
Albumin: 4.3 g/dL (ref 4.1–5.1)
Alkaline Phosphatase: 72 IU/L (ref 44–121)
BUN/Creatinine Ratio: 15 (ref 9–20)
BUN: 19 mg/dL (ref 6–24)
Bilirubin Total: 1 mg/dL (ref 0.0–1.2)
CO2: 25 mmol/L (ref 20–29)
Calcium: 10.2 mg/dL (ref 8.7–10.2)
Chloride: 96 mmol/L (ref 96–106)
Creatinine, Ser: 1.29 mg/dL — ABNORMAL HIGH (ref 0.76–1.27)
Globulin, Total: 3.3 g/dL (ref 1.5–4.5)
Glucose: 132 mg/dL — ABNORMAL HIGH (ref 70–99)
Potassium: 4.5 mmol/L (ref 3.5–5.2)
Sodium: 136 mmol/L (ref 134–144)
Total Protein: 7.6 g/dL (ref 6.0–8.5)
eGFR: 68 mL/min/{1.73_m2} (ref >59)

## 2024-03-28 LAB — INSULIN, RANDOM: INSULIN: 12.4 u[IU]/mL (ref 2.6–24.9)

## 2024-03-28 LAB — MICROALBUMIN / CREATININE URINE RATIO
Creatinine, Urine: 445.3 mg/dL
Microalb/Creat Ratio: 52 mg/g{creat} — ABNORMAL HIGH (ref 0–29)
Microalbumin, Urine: 233 ug/mL

## 2024-03-28 LAB — HEMOGLOBIN A1C
Est. average glucose Bld gHb Est-mCnc: 203 mg/dL
Hgb A1c MFr Bld: 8.7 % — ABNORMAL HIGH (ref 4.8–5.6)

## 2024-03-28 LAB — VITAMIN D 25 HYDROXY (VIT D DEFICIENCY, FRACTURES): Vit D, 25-Hydroxy: 25.7 ng/mL — ABNORMAL LOW (ref 30.0–100.0)

## 2024-04-01 ENCOUNTER — Encounter: Payer: Self-pay | Admitting: Bariatrics

## 2024-04-01 DIAGNOSIS — R809 Proteinuria, unspecified: Secondary | ICD-10-CM | POA: Insufficient documentation

## 2024-04-01 DIAGNOSIS — E559 Vitamin D deficiency, unspecified: Secondary | ICD-10-CM | POA: Insufficient documentation

## 2024-04-10 ENCOUNTER — Ambulatory Visit (INDEPENDENT_AMBULATORY_CARE_PROVIDER_SITE_OTHER): Admitting: Bariatrics

## 2024-04-10 ENCOUNTER — Encounter: Payer: Self-pay | Admitting: Bariatrics

## 2024-04-10 VITALS — BP 143/84 | HR 89 | Temp 97.8°F | Ht 69.0 in | Wt 378.0 lb

## 2024-04-10 DIAGNOSIS — E119 Type 2 diabetes mellitus without complications: Secondary | ICD-10-CM

## 2024-04-10 DIAGNOSIS — Z7984 Long term (current) use of oral hypoglycemic drugs: Secondary | ICD-10-CM

## 2024-04-10 DIAGNOSIS — R809 Proteinuria, unspecified: Secondary | ICD-10-CM | POA: Diagnosis not present

## 2024-04-10 DIAGNOSIS — Z6841 Body Mass Index (BMI) 40.0 and over, adult: Secondary | ICD-10-CM

## 2024-04-10 DIAGNOSIS — E559 Vitamin D deficiency, unspecified: Secondary | ICD-10-CM | POA: Diagnosis not present

## 2024-04-10 DIAGNOSIS — E1165 Type 2 diabetes mellitus with hyperglycemia: Secondary | ICD-10-CM

## 2024-04-10 DIAGNOSIS — E66813 Obesity, class 3: Secondary | ICD-10-CM

## 2024-04-10 MED ORDER — DAPAGLIFLOZIN PROPANEDIOL 5 MG PO TABS: 5.0000 mg | ORAL_TABLET | Freq: Every day | ORAL | 0 refills | Status: DC

## 2024-04-10 MED ORDER — VITAMIN D (ERGOCALCIFEROL) 1.25 MG (50000 UNIT) PO CAPS: 50000.0000 [IU] | ORAL_CAPSULE | ORAL | 0 refills | Status: DC

## 2024-04-10 MED ORDER — TIRZEPATIDE 2.5 MG/0.5ML ~~LOC~~ SOAJ: 2.5000 mg | SUBCUTANEOUS | 0 refills | Status: DC

## 2024-04-10 NOTE — Progress Notes (Signed)
 First follow-up after initial visit.        WEIGHT SUMMARY AND BIOMETRICS  Weight Lost Since Last Visit: 11lb  Weight Gained Since Last Visit: 0   Vitals Temp: 97.8 F (36.6 C) BP: (!) 143/84 Pulse Rate: 89 SpO2: 97 %   Anthropometric Measurements Height: 5\' 9"  (1.753 m) Weight: (!) 378 lb (171.5 kg) BMI (Calculated): 55.8 Weight at Last Visit: 389lb Weight Lost Since Last Visit: 11lb Weight Gained Since Last Visit: 0 Starting Weight: 389lb Total Weight Loss (lbs): 11 lb (4.99 kg)   Body Composition  Body Fat %: 49.1 % Fat Mass (lbs): 185.6 lbs Muscle Mass (lbs): 183 lbs Visceral Fat Rating : 39   Other Clinical Data Fasting: no Labs: no Today's Visit #: 2 Starting Date: 03/27/24    OBESITY Sammuel is here to discuss his progress with his obesity treatment plan along with follow-up of his obesity related diagnoses.    Nutrition Plan: the Category 4 plan and keeping a food journal with goal of 2,000 calories and 120-160 grams of protein daily - 70% adherence.  Current exercise: walking  Interim History:  He is down 11 lbs since his last visit.  Eating all of the food on the plan., Protein intake is as prescribed, Is not drinking sugar sweetened beverages., Is not skipping meals, and Water intake is adequate.  Initial positives regarding the dietary plan:  Initial challenges regarding  the dietary plan:   Pharmacotherapy: Treyven is on Metformin  500 mg twice daily with meals Adverse side effects: None Hunger is moderately controlled.  Cravings are moderately controlled.  Assessment/Plan:   Type II Diabetes HgbA1c is not at goal. Last A1c was 8.7 CBGs: Not checking      Episodes of hypoglycemia: no Medication(s): Metformin  500 mg twice daily with meals He is a diabetic and has microalbuminuria and is not on anything for kidney  protection.  He has been on an ACE inhibitor in the past but had angioedema.   Lab Results  Component Value Date   HGBA1C 8.7 (H) 03/27/2024   HGBA1C 8.2 (H) 12/08/2023   HGBA1C 9.2 (A) 02/15/2023   Lab Results  Component Value Date   LDLCALC 109 (H) 12/08/2023   CREATININE 1.29 (H) 03/27/2024   No results found for: "GFR"  Plan: Continue Metformin  2 tablets of 500 mg 2 times daily with a meal for a total of 2000 mg daily  Rx: Farxiga  5 mg by mouth daily before breakfast #30 with no refills (new medication start to protect kidneys).  Will keep all carbohydrates low both sweets and starches.  Will continue exercise regimen to 30 to 60 minutes on most days of the week.  Aim for 7 to 9 hours of sleep nightly.  Eat more low glycemic index foods.  Rx: Mounjaro 2.5 mg into the skin weekly, 1 month supply with no refills.   Microalbuminuria:   He has microalbuminuria secondary to diabetes.  He is not on a kidney protective medicine.  He was as on an ACE inhibitor in the past but had angioedema and stopped the medication.  Plan: Rx: Farxiga  5 mg 1 p.o. daily with breakfast #30 with no refills. He will increase his water intake to at least 64 ounces and up to 80 ounces if he is more active. Decrease soda consumption.  Vitamin D  Deficiency Vitamin D  is not at goal of 50.  Most recent vitamin D  level was 25.7. He is on  prescription ergocalciferol  50,000 IU weekly. Lab Results  Component Value Date   VD25OH 25.7 (L) 03/27/2024   VD25OH 50 12/08/2020   VD25OH 24 (L) 07/13/2020    Plan: Begin prescription vitamin D  50,000 IU weekly.     Morbid Obesity: Current BMI BMI (Calculated): 55.8   Pharmacotherapy Plan Continue metformin  to 1000 mg twice daily with meals.  Will start Farxiga  for kidney protection.  And will begin Mounjaro which may or may not be covered by insurance.  Zarion is currently in the action stage of change. As such, his goal is to continue with weight loss  efforts.  He has agreed to keeping a food journal with goal of 2,00 calories and 120 to 160 grams of protein daily.  Exercise goals: All adults should avoid inactivity. Some physical activity is better than none, and adults who participate in any amount of physical activity gain some health benefits.  Behavioral modification strategies: increasing lean protein intake, no meal skipping, meal planning , increase water intake, better snacking choices, planning for success, increasing vegetables, get rid of junk food in the home, decrease snacking , avoiding temptations, keep healthy foods in the home, weigh protein portions, and mindful eating.  Story has agreed to follow-up with our clinic in 2 weeks.    Labs reviewed today from last visit (CMP,  HgbA1c, insulin , vitamin D , B 12).   Objective:   VITALS: Per patient if applicable, see vitals. GENERAL: Alert and in no acute distress. CARDIOPULMONARY: No increased WOB. Speaking in clear sentences.  PSYCH: Pleasant and cooperative. Speech normal rate and rhythm. Affect is appropriate. Insight and judgement are appropriate. Attention is focused, linear, and appropriate.  NEURO: Oriented as arrived to appointment on time with no prompting.   Attestation Statements:   This was prepared with the assistance of Engineer, civil (consulting).  Occasional wrong-word or sound-a-like substitutions may have occurred due to the inherent limitations of voice recognition software.   Kirk Peper, DO

## 2024-04-24 ENCOUNTER — Encounter: Payer: Self-pay | Admitting: Pharmacist

## 2024-04-24 ENCOUNTER — Other Ambulatory Visit (HOSPITAL_BASED_OUTPATIENT_CLINIC_OR_DEPARTMENT_OTHER): Payer: Self-pay | Admitting: Pharmacist

## 2024-04-24 ENCOUNTER — Telehealth: Payer: Self-pay | Admitting: Family

## 2024-04-24 ENCOUNTER — Other Ambulatory Visit: Payer: Self-pay

## 2024-04-24 DIAGNOSIS — Z7985 Long-term (current) use of injectable non-insulin antidiabetic drugs: Secondary | ICD-10-CM

## 2024-04-24 DIAGNOSIS — Z7984 Long term (current) use of oral hypoglycemic drugs: Secondary | ICD-10-CM

## 2024-04-24 DIAGNOSIS — E119 Type 2 diabetes mellitus without complications: Secondary | ICD-10-CM

## 2024-04-24 NOTE — Progress Notes (Signed)
 Pharmacy TNM Diabetes Measure Review  S:  Patient was identified in a report as being at risk for failing the True [place] Metric of A1c control. Last A1c was 8.7%. Last PCP visit was 11/2023.  Call placed to patient to discuss diabetes control and medication management. Patient was unavailable, however, I was able to connect with his pharmacy. Pharmacy confirms coverage of his DM regimen and he picked these medications up at the end of last month. From an adherence standpoint, everything looks good.   Current diabetes medications include: Farxiga  5 mg daily, metformin  500 mg - two tablets (1000mg ) BID, Mounjaro 2.5 mg weekly   Insurance coverage: BCBS - covers current medication regimen   O:  Lab Results  Component Value Date   HGBA1C 8.7 (H) 03/27/2024   There were no vitals filed for this visit.  Lipid Panel     Component Value Date/Time   CHOL 168 12/08/2023 1425   TRIG 104 12/08/2023 1425   HDL 40 12/08/2023 1425   CHOLHDL 4.2 12/08/2023 1425   CHOLHDL 4.5 07/13/2020 1451   LDLCALC 109 (H) 12/08/2023 1425   LDLCALC 121 (H) 07/13/2020 1451   Clinical Atherosclerotic Cardiovascular Disease (ASCVD): No  The 10-year ASCVD risk score (Arnett DK, et al., 2019) is: 19.7%   Values used to calculate the score:     Age: 53 years     Sex: Male     Is Non-Hispanic African American: Yes     Diabetic: Yes     Tobacco smoker: No     Systolic Blood Pressure: 143 mmHg     Is BP treated: Yes     HDL Cholesterol: 40 mg/dL     Total Cholesterol: 168 mg/dL   Patient is participating in a Managed Medicaid Plan: No   A/P: Diabetes longstanding currently uncontrolled. His diabetes is being managed by [facility]. Of note, his last appt with Dr. Bevin Bucks was on 04/10/2024 and he was started on Farxiga  and Mounjaro. I called his pharmacy and confirmed the Florence Hunt was covered by insurance. Pt picked this up on 04/15/24 along with the Farxiga . He has a follow-up appt scheduled for  05/08/24. Will set reminders for refills to ensure the patient is able to afford and fill his medication.  - Recommend  to continue titration of Mounjaro as tolerated. -No changes made to medications. Pharmacy confirms coverage and adherence.  -Next A1c anticipated 06/2024.   Follow-up:  Pharmacist prn.  [facility]: 05/08/2024  Marene Shape, PharmD, BCACP, CPP Clinical Pharmacist [facility] [phone]

## 2024-04-24 NOTE — Telephone Encounter (Signed)
 Call returned to patient. Discussed documentation from pharmacy outreach today. All questions and concerns addressed.

## 2024-04-24 NOTE — Telephone Encounter (Signed)
 Copied from CRM [id number]. Topic: General - Other >> Apr 24, 2024  3:44 PM Antwanette L wrote:  Reason for CRM: Patient had a missed call from [employer]. Mara Seminole tried to contact the patient to discuss diabetes control and medication management. The patient is requesting a callback at [phone]

## 2024-05-06 DIAGNOSIS — F3181 Bipolar II disorder: Secondary | ICD-10-CM | POA: Diagnosis not present

## 2024-05-08 ENCOUNTER — Ambulatory Visit (INDEPENDENT_AMBULATORY_CARE_PROVIDER_SITE_OTHER): Admitting: Bariatrics

## 2024-05-08 ENCOUNTER — Encounter: Payer: Self-pay | Admitting: Bariatrics

## 2024-05-08 VITALS — BP 127/80 | HR 91 | Temp 98.0°F | Ht 69.0 in | Wt 367.0 lb

## 2024-05-08 DIAGNOSIS — Z7985 Long-term (current) use of injectable non-insulin antidiabetic drugs: Secondary | ICD-10-CM

## 2024-05-08 DIAGNOSIS — E1165 Type 2 diabetes mellitus with hyperglycemia: Secondary | ICD-10-CM

## 2024-05-08 DIAGNOSIS — E119 Type 2 diabetes mellitus without complications: Secondary | ICD-10-CM | POA: Diagnosis not present

## 2024-05-08 DIAGNOSIS — Z6841 Body Mass Index (BMI) 40.0 and over, adult: Secondary | ICD-10-CM | POA: Diagnosis not present

## 2024-05-08 DIAGNOSIS — E66813 Obesity, class 3: Secondary | ICD-10-CM

## 2024-05-08 DIAGNOSIS — R809 Proteinuria, unspecified: Secondary | ICD-10-CM

## 2024-05-08 MED ORDER — OMEPRAZOLE 20 MG PO CPDR: 20.0000 mg | DELAYED_RELEASE_CAPSULE | Freq: Every day | ORAL | 0 refills | Status: DC

## 2024-05-08 MED ORDER — TIRZEPATIDE 5 MG/0.5ML ~~LOC~~ SOAJ: 5.0000 mg | SUBCUTANEOUS | 0 refills | Status: DC

## 2024-05-08 NOTE — Progress Notes (Signed)
 WEIGHT SUMMARY AND BIOMETRICS  Weight Lost Since Last Visit: 11lb  Weight Gained Since Last Visit: 0   Vitals Temp: 98 F (36.7 C) BP: 127/80 Pulse Rate: 91 SpO2: 95 %   Anthropometric Measurements Height: 5\' 9"  (1.753 m) Weight: (!) 367 lb (166.5 kg) BMI (Calculated): 54.17 Weight at Last Visit: 378lb Weight Lost Since Last Visit: 11lb Weight Gained Since Last Visit: 0 Starting Weight: 389lb Total Weight Loss (lbs): 22 lb (9.979 kg)   Body Composition  Body Fat %: 48.4 % Fat Mass (lbs): 177.6 lbs Muscle Mass (lbs): 180.2 lbs Visceral Fat Rating : 37   Other Clinical Data Fasting: no Labs: no Today's Visit #: 2 Starting Date: 03/27/24    OBESITY Vincent Ruiz is here to discuss his progress with his obesity treatment plan along with follow-up of his obesity related diagnoses.    Nutrition Plan: the Category 4 plan - 70% adherence.  Current exercise: walking  Interim History:  He is down 11 pounds since his last visit Eating all of the food on the plan., Is not skipping meals, Meeting calorie goals., and Water intake is adequate.   Pharmacotherapy: Vincent Ruiz is on Mounjaro 2.5 mg SQ weekly Adverse side effects: GERD mild Hunger is moderately controlled.  Cravings are moderately controlled.  Assessment/Plan:   Type II Diabetes HgbA1c is not at goal. Last A1c was 8.7 Episodes of hypoglycemia: no Medication(s): Mounjaro 2.5 mg SQ weekly  Lab Results  Component Value Date   HGBA1C 8.7 (H) 03/27/2024   HGBA1C 8.2 (H) 12/08/2023   HGBA1C 9.2 (A) 02/15/2023   Lab Results  Component Value Date   LDLCALC 109 (H) 12/08/2023   CREATININE 1.29 (H) 03/27/2024   No results found for: "GFR"  Plan: Continue and increase dose Mounjaro 5.0 mg SQ weekly Continue all other medications.  Will keep all carbohydrates low both sweets and starches.  Will  continue exercise regimen to 30 to 60 minutes on most days of the week.  Aim for 7 to 9 hours of sleep nightly.  Eat more low glycemic index foods.  He will begin Prilosec as needed and some Papaya enzymes.  Rx: Prilosec 20 mg 1 daily # 30 with no refills.   Microalbuminuria:   He has a history of protein in his urine most likely related to uncontrolled diabetes. He is now on Mounjaro and working on his food plan.   Plan: Will continue plan and exercise.    Morbid Obesity: Current BMI BMI (Calculated): 54.17   Pharmacotherapy Plan Continue and increase dose  Mounjaro 5.0 mg SQ weekly  Vincent Ruiz is currently in the action stage of change. As such, his goal is to continue with weight loss efforts.  He has agreed to the Category 4 plan.  Exercise goals: All adults should avoid inactivity. Some physical activity is better than none, and adults who participate in  any amount of physical activity gain some health benefits.  Behavioral modification strategies: increasing lean protein intake, no meal skipping, decrease eating out, meal planning , increase water intake, better snacking choices, planning for success, avoiding temptations, keep healthy foods in the home, weigh protein portions, and mindful eating.  Vincent Ruiz has agreed to follow-up with our clinic in 2 weeks.    Objective:   VITALS: Per patient if applicable, see vitals. GENERAL: Alert and in no acute distress. CARDIOPULMONARY: No increased WOB. Speaking in clear sentences.  PSYCH: Pleasant and cooperative. Speech normal rate and rhythm. Affect is appropriate. Insight and judgement are appropriate. Attention is focused, linear, and appropriate.  NEURO: Oriented as arrived to appointment on time with no prompting.   Attestation Statements:   This was prepared with the assistance of Engineer, civil (consulting).  Occasional wrong-word or sound-a-like substitutions may have occurred due to the inherent limitations of voice recognition    Kirk Peper, DO

## 2024-05-13 ENCOUNTER — Other Ambulatory Visit: Payer: Self-pay | Admitting: Family

## 2024-05-13 DIAGNOSIS — N529 Male erectile dysfunction, unspecified: Secondary | ICD-10-CM

## 2024-05-14 NOTE — Telephone Encounter (Signed)
 Complete

## 2024-05-19 ENCOUNTER — Other Ambulatory Visit: Payer: Self-pay | Admitting: Bariatrics

## 2024-05-20 ENCOUNTER — Telehealth: Payer: Self-pay | Admitting: Bariatrics

## 2024-05-20 ENCOUNTER — Telehealth: Payer: Self-pay | Admitting: Family

## 2024-05-20 NOTE — Telephone Encounter (Signed)
 Patient was identified as falling into the [place] Measure - Diabetes.   Patient was: Patient is not currently using our practice.  Patient is using Amy As PCP but he goes to [facility] and they do his lab work , last A1c was 03/26/2024. Patient has appointment scheduled with them in June for Follow up.

## 2024-05-20 NOTE — Telephone Encounter (Signed)
 Pt is calling to see why his RX for Farxiga  was denied. Please call patient with details.

## 2024-05-21 ENCOUNTER — Other Ambulatory Visit: Payer: Self-pay | Admitting: Nurse Practitioner

## 2024-05-21 MED ORDER — DAPAGLIFLOZIN PROPANEDIOL 5 MG PO TABS: 5.0000 mg | ORAL_TABLET | Freq: Every day | ORAL | 0 refills | Status: DC

## 2024-06-06 ENCOUNTER — Encounter: Payer: Self-pay | Admitting: Bariatrics

## 2024-06-06 ENCOUNTER — Ambulatory Visit (INDEPENDENT_AMBULATORY_CARE_PROVIDER_SITE_OTHER): Admitting: Bariatrics

## 2024-06-06 VITALS — BP 105/67 | HR 85 | Temp 97.7°F | Ht 69.0 in | Wt 366.0 lb

## 2024-06-06 DIAGNOSIS — Z6841 Body Mass Index (BMI) 40.0 and over, adult: Secondary | ICD-10-CM

## 2024-06-06 DIAGNOSIS — Z7985 Long-term (current) use of injectable non-insulin antidiabetic drugs: Secondary | ICD-10-CM

## 2024-06-06 DIAGNOSIS — E559 Vitamin D deficiency, unspecified: Secondary | ICD-10-CM | POA: Diagnosis not present

## 2024-06-06 DIAGNOSIS — E1165 Type 2 diabetes mellitus with hyperglycemia: Secondary | ICD-10-CM | POA: Diagnosis not present

## 2024-06-06 MED ORDER — VITAMIN D (ERGOCALCIFEROL) 1.25 MG (50000 UNIT) PO CAPS: 50000.0000 [IU] | ORAL_CAPSULE | ORAL | 0 refills | Status: DC

## 2024-06-06 MED ORDER — TIRZEPATIDE 7.5 MG/0.5ML ~~LOC~~ SOAJ: 7.5000 mg | SUBCUTANEOUS | 0 refills | Status: DC

## 2024-06-06 NOTE — Progress Notes (Signed)
 WEIGHT SUMMARY AND BIOMETRICS  Weight Lost Since Last Visit: 1lb  Weight Gained Since Last Visit: 0   Vitals Temp: 97.7 F (36.5 C) BP: 105/67 Pulse Rate: 85 SpO2: 97 %   Anthropometric Measurements Height: 5' 9 (1.753 m) Weight: (!) 366 lb (166 kg) BMI (Calculated): 54.02 Weight at Last Visit: 367lb Weight Lost Since Last Visit: 1lb Weight Gained Since Last Visit: 0 Starting Weight: 389lb Total Weight Loss (lbs): 23 lb (10.4 kg)   Body Composition  Body Fat %: 48.5 % Fat Mass (lbs): 177.6 lbs Muscle Mass (lbs): 179.4 lbs Visceral Fat Rating : 37   Other Clinical Data Fasting: no Labs: no Today's Visit #: 3 Starting Date: 03/27/24    OBESITY Vincent Ruiz is here to discuss his progress with his obesity treatment plan along with follow-up of his obesity related diagnoses.    Nutrition Plan: the Category 4 plan - 50% adherence.  Current exercise: walking  Interim History:  He is down 1 lb since her last visit.  Eating all of the food on the plan., Protein intake is as prescribed, Is not skipping meals, and Water intake is adequate.   Pharmacotherapy: Vincent Ruiz is on Mounjaro  5.0 mg SQ weekly Adverse side effects: None Hunger is moderately controlled.  Cravings are moderately controlled, better.  Assessment/Plan:   Type II Diabetes HgbA1c is at goal. Last A1c was 8.7 CBGs: Not checking      Episodes of hypoglycemia: no Medication(s): Mounjaro  5.0 mg SQ weekly  Lab Results  Component Value Date   HGBA1C 8.7 (H) 03/27/2024   HGBA1C 8.2 (H) 12/08/2023   HGBA1C 9.2 (A) 02/15/2023   Lab Results  Component Value Date   LDLCALC 109 (H) 12/08/2023   CREATININE 1.29 (H) 03/27/2024   No results found for: GFR  Plan: Continue and increase dose Mounjaro  7.5 mg SQ weekly Continue all other medications.  Will keep all carbohydrates low both  sweets and starches.  Will continue exercise regimen to 30 to 60 minutes on most days of the week.  Aim for 7 to 9 hours of sleep nightly.  Will pack his lunch for work..   Vitamin D  Deficiency Vitamin D  is at goal of 50.  Most recent vitamin D  level was 25.7. He is on  prescription ergocalciferol  50,000 IU weekly. Lab Results  Component Value Date   VD25OH 25.7 (L) 03/27/2024   VD25OH 50 12/08/2020   VD25OH 24 (L) 07/13/2020    Plan: Refill prescription vitamin D  50,000 IU weekly.   He will get some sun exposure.    Morbid Obesity: Current BMI BMI (Calculated): 54.02   Pharmacotherapy Plan Continue and increase dose  Mounjaro  7.5 mg SQ weekly  Vincent Ruiz is currently in the action stage of change. As such, his goal is to continue with weight loss efforts.  He has agreed to the Category 4 plan.  Exercise goals: All adults should avoid inactivity. Some physical activity is better than none, and adults who participate in any amount of physical activity gain some health benefits.  Behavioral modification strategies: increasing lean protein intake, decreasing simple carbohydrates , no meal skipping, meal planning , increase water intake, better snacking choices, planning for success, increasing fiber rich foods, measure portion sizes, work on smaller portions, pack lunch for work, and mindful eating.  Vincent Ruiz has agreed to follow-up with our clinic in 4 weeks.    Objective:   VITALS: Per patient if applicable, see vitals. GENERAL: Alert and in no acute distress. CARDIOPULMONARY: No increased WOB. Speaking in clear sentences.  PSYCH: Pleasant and cooperative. Speech normal rate and rhythm. Affect is appropriate. Insight and judgement are appropriate. Attention is focused, linear, and appropriate.  NEURO: Oriented as arrived to appointment on time with no prompting.   Attestation Statements:   This was prepared with the assistance of Engineer, civil (consulting).  Occasional wrong-word  or sound-a-like substitutions may have occurred due to the inherent limitations of voice recognition   Kirk Peper, DO

## 2024-06-08 ENCOUNTER — Other Ambulatory Visit: Payer: Self-pay | Admitting: Family

## 2024-06-08 DIAGNOSIS — E1165 Type 2 diabetes mellitus with hyperglycemia: Secondary | ICD-10-CM

## 2024-06-08 DIAGNOSIS — I10 Essential (primary) hypertension: Secondary | ICD-10-CM

## 2024-06-09 ENCOUNTER — Other Ambulatory Visit: Payer: Self-pay | Admitting: Family

## 2024-06-09 DIAGNOSIS — I10 Essential (primary) hypertension: Secondary | ICD-10-CM

## 2024-06-10 ENCOUNTER — Other Ambulatory Visit: Payer: Self-pay | Admitting: Bariatrics

## 2024-06-10 NOTE — Telephone Encounter (Signed)
 Complete

## 2024-06-25 DIAGNOSIS — E113311 Type 2 diabetes mellitus with moderate nonproliferative diabetic retinopathy with macular edema, right eye: Secondary | ICD-10-CM | POA: Diagnosis not present

## 2024-06-25 DIAGNOSIS — E113412 Type 2 diabetes mellitus with severe nonproliferative diabetic retinopathy with macular edema, left eye: Secondary | ICD-10-CM | POA: Diagnosis not present

## 2024-06-26 ENCOUNTER — Other Ambulatory Visit: Payer: Self-pay | Admitting: Nurse Practitioner

## 2024-06-26 LAB — HM DIABETES EYE EXAM

## 2024-06-27 ENCOUNTER — Other Ambulatory Visit: Payer: Self-pay | Admitting: Bariatrics

## 2024-06-27 ENCOUNTER — Telehealth: Payer: Self-pay | Admitting: Bariatrics

## 2024-06-27 MED ORDER — DAPAGLIFLOZIN PROPANEDIOL 5 MG PO TABS: 5.0000 mg | ORAL_TABLET | Freq: Every day | ORAL | 0 refills | Status: DC

## 2024-06-27 NOTE — Telephone Encounter (Signed)
 Can you please advise? Patient was last seen on 05/21/24 and has a follow-up 07/04/24

## 2024-06-27 NOTE — Telephone Encounter (Signed)
 Patient stated he is completely out of Farixga and needs a refill.

## 2024-07-04 ENCOUNTER — Encounter: Payer: Self-pay | Admitting: Bariatrics

## 2024-07-04 ENCOUNTER — Ambulatory Visit (INDEPENDENT_AMBULATORY_CARE_PROVIDER_SITE_OTHER): Admitting: Bariatrics

## 2024-07-04 VITALS — BP 115/71 | HR 92 | Temp 97.8°F | Ht 69.0 in | Wt 357.0 lb

## 2024-07-04 DIAGNOSIS — E1165 Type 2 diabetes mellitus with hyperglycemia: Secondary | ICD-10-CM

## 2024-07-04 DIAGNOSIS — Z6841 Body Mass Index (BMI) 40.0 and over, adult: Secondary | ICD-10-CM

## 2024-07-04 DIAGNOSIS — E559 Vitamin D deficiency, unspecified: Secondary | ICD-10-CM | POA: Diagnosis not present

## 2024-07-04 DIAGNOSIS — E785 Hyperlipidemia, unspecified: Secondary | ICD-10-CM

## 2024-07-04 DIAGNOSIS — E119 Type 2 diabetes mellitus without complications: Secondary | ICD-10-CM | POA: Diagnosis not present

## 2024-07-04 DIAGNOSIS — R600 Localized edema: Secondary | ICD-10-CM

## 2024-07-04 DIAGNOSIS — E113412 Type 2 diabetes mellitus with severe nonproliferative diabetic retinopathy with macular edema, left eye: Secondary | ICD-10-CM | POA: Diagnosis not present

## 2024-07-04 DIAGNOSIS — Z7985 Long-term (current) use of injectable non-insulin antidiabetic drugs: Secondary | ICD-10-CM

## 2024-07-04 DIAGNOSIS — E113413 Type 2 diabetes mellitus with severe nonproliferative diabetic retinopathy with macular edema, bilateral: Secondary | ICD-10-CM | POA: Diagnosis not present

## 2024-07-04 DIAGNOSIS — E113411 Type 2 diabetes mellitus with severe nonproliferative diabetic retinopathy with macular edema, right eye: Secondary | ICD-10-CM | POA: Diagnosis not present

## 2024-07-04 MED ORDER — TIRZEPATIDE 7.5 MG/0.5ML ~~LOC~~ SOAJ: 7.5000 mg | SUBCUTANEOUS | 0 refills | Status: DC

## 2024-07-04 MED ORDER — VITAMIN D (ERGOCALCIFEROL) 1.25 MG (50000 UNIT) PO CAPS: 50000.0000 [IU] | ORAL_CAPSULE | ORAL | 0 refills | Status: DC

## 2024-07-04 NOTE — Progress Notes (Signed)
 WEIGHT SUMMARY AND BIOMETRICS  Weight Lost Since Last Visit: 9lb  Weight Gained Since Last Visit: 0   Vitals Temp: 97.8 F (36.6 C) BP: 115/71 Pulse Rate: 92 SpO2: 94 %   Anthropometric Measurements Height: 5' 9 (1.753 m) Weight: (!) 357 lb (161.9 kg) BMI (Calculated): 52.7 Weight at Last Visit: 366lb Weight Lost Since Last Visit: 9lb Weight Gained Since Last Visit: 0 Starting Weight: 389lb Total Weight Loss (lbs): 32 lb (14.5 kg)   Body Composition  Body Fat %: 47.1 % Fat Mass (lbs): 168.4 lbs Muscle Mass (lbs): 179.8 lbs Visceral Fat Rating : 35   Other Clinical Data Fasting: no Labs: no Today's Visit #: 4 Starting Date: 03/27/24    OBESITY Savyon is here to discuss his progress with his obesity treatment plan along with follow-up of his obesity related diagnoses.    Nutrition Plan: the Category 4 plan - 60% adherence.  Current exercise: walking  Interim History:  He is down 9 lbs since his last visit. 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  He states that he also does not urinate much at night but then urinates a lot after he goes home and puts up his feet. He is not currently wearing any support hose. Eating all of the food on the plan., Protein intake is as prescribed, Is not skipping meals, and Water intake is adequate.   Pharmacotherapy: Nikkolas is on Mounjaro  7.5 mg SQ weekly Adverse side effects: None Hunger is moderately controlled.  Cravings are moderately controlled.  Assessment/Plan:    Hyperlipidemia:   Taking medications as directed.   Plan: Will check lipids. Continue medications. Continue diet and exercise.    Vitamin D  deficiency:   Taking vitamin D  prescription/OTC  Plan: Continue vitamin D . Will check vitamin D .    Type 2 Diabetes:   He needs labs today. His  HgbA1c was 8.7 at the last lab draw. He is taking medications as directed.   Plan: Will check HgbA1c and insulin . Will continue to work on the plan and exercise as able.   Labs done today (CMP, Lipids, HgbA1c, vitamin D ).   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  He is not wearing any support hose at this time.  Plan: Will begin to wear support hose at night and will put the support hose on in the morning or before his shift at night. If this does not cause sufficient relief then we will consider a low-dose diuretic. Continue weight loss.   Morbid Obesity: Current BMI BMI (Calculated): 52.7   Pharmacotherapy Plan Continue and refill  Mounjaro  7.5 mg SQ weekly  Pressley is currently in the action stage of change. As such, his goal is to continue with weight loss efforts.  He has agreed to the Category  4 plan.  Exercise goals: All adults should avoid inactivity. Some physical activity is better than none, and adults who participate in any amount of physical activity gain some health benefits.  Behavioral modification strategies: increasing lean protein intake, no meal skipping, decrease eating out, meal planning , increase water intake, better snacking choices, planning for success, increasing vegetables, increasing fiber rich foods, decrease snacking , avoiding temptations, and mindful eating.  Jayion has agreed to follow-up with our clinic in 4 weeks.   Objective:   VITALS: Per patient if applicable, see vitals. GENERAL: Alert and in no acute distress. CARDIOPULMONARY: No increased WOB. Speaking in clear sentences.  PSYCH: Pleasant and cooperative. Speech normal rate and rhythm. Affect is appropriate. Insight and judgement are appropriate. Attention is focused, linear, and appropriate.  NEURO: Oriented as arrived to appointment on time with no prompting.   Attestation Statements:   This was prepared with the assistance of Licensed conveyancer.  Occasional wrong-word or sound-a-like substitutions may have occurred due to the inherent limitations of voice recognition   Clayborne Daring, DO

## 2024-07-05 LAB — LIPID PANEL WITH LDL/HDL RATIO
Cholesterol, Total: 110 mg/dL (ref 100–199)
HDL: 40 mg/dL (ref >39)
LDL Chol Calc (NIH): 54 mg/dL (ref 0–99)
LDL/HDL Ratio: 1.4 ratio (ref 0.0–3.6)
Triglycerides: 77 mg/dL (ref 0–149)
VLDL Cholesterol Cal: 16 mg/dL (ref 5–40)

## 2024-07-05 LAB — HEMOGLOBIN A1C
Est. average glucose Bld gHb Est-mCnc: 160 mg/dL
Hgb A1c MFr Bld: 7.2 % — ABNORMAL HIGH (ref 4.8–5.6)

## 2024-07-05 LAB — COMPREHENSIVE METABOLIC PANEL WITH GFR
ALT: 16 IU/L (ref 0–44)
AST: 29 IU/L (ref 0–40)
Albumin: 4.4 g/dL (ref 4.1–5.1)
Alkaline Phosphatase: 68 IU/L (ref 44–121)
BUN/Creatinine Ratio: 14 (ref 9–20)
BUN: 36 mg/dL — ABNORMAL HIGH (ref 6–24)
Bilirubin Total: 1.3 mg/dL — ABNORMAL HIGH (ref 0.0–1.2)
CO2: 19 mmol/L — ABNORMAL LOW (ref 20–29)
Calcium: 9.8 mg/dL (ref 8.7–10.2)
Chloride: 98 mmol/L (ref 96–106)
Creatinine, Ser: 2.5 mg/dL — ABNORMAL HIGH (ref 0.76–1.27)
Globulin, Total: 3.5 g/dL (ref 1.5–4.5)
Glucose: 94 mg/dL (ref 70–99)
Potassium: 4.4 mmol/L (ref 3.5–5.2)
Sodium: 138 mmol/L (ref 134–144)
Total Protein: 7.9 g/dL (ref 6.0–8.5)
eGFR: 31 mL/min/1.73 — ABNORMAL LOW (ref >59)

## 2024-07-05 LAB — VITAMIN D 25 HYDROXY (VIT D DEFICIENCY, FRACTURES): Vit D, 25-Hydroxy: 48.9 ng/mL (ref 30.0–100.0)

## 2024-07-11 ENCOUNTER — Ambulatory Visit: Payer: Self-pay

## 2024-07-11 ENCOUNTER — Telehealth: Payer: Self-pay | Admitting: Bariatrics

## 2024-07-11 NOTE — Telephone Encounter (Signed)
-----   Message from Clayborne Daring sent at 07/10/2024  3:52 PM EDT ----- Regarding: labs Call pt. His A1c is very much improved. He was very dehydrated when his blood was drawn. He needs to drink more water. I will check his kidney function again at his next visit.  ----- Message ----- From: Interface, [facility] Lab Results In Sent: 07/05/2024   5:38 AM EDT To: Clayborne DELENA Daring, DO

## 2024-07-11 NOTE — Telephone Encounter (Signed)
Left message for patient to return call regarding lab results.  

## 2024-07-11 NOTE — Telephone Encounter (Signed)
 Pt is calling because he has question about lab results.

## 2024-07-12 ENCOUNTER — Other Ambulatory Visit: Payer: Self-pay | Admitting: Family

## 2024-07-12 DIAGNOSIS — E785 Hyperlipidemia, unspecified: Secondary | ICD-10-CM

## 2024-07-12 NOTE — Telephone Encounter (Signed)
 Complete

## 2024-07-15 NOTE — Telephone Encounter (Signed)
 Left message for patient to return call.

## 2024-07-18 DIAGNOSIS — E113411 Type 2 diabetes mellitus with severe nonproliferative diabetic retinopathy with macular edema, right eye: Secondary | ICD-10-CM | POA: Diagnosis not present

## 2024-07-21 ENCOUNTER — Other Ambulatory Visit: Payer: Self-pay | Admitting: Bariatrics

## 2024-07-29 DIAGNOSIS — F3181 Bipolar II disorder: Secondary | ICD-10-CM | POA: Diagnosis not present

## 2024-07-31 ENCOUNTER — Other Ambulatory Visit: Payer: Self-pay | Admitting: Bariatrics

## 2024-08-02 DIAGNOSIS — E113412 Type 2 diabetes mellitus with severe nonproliferative diabetic retinopathy with macular edema, left eye: Secondary | ICD-10-CM | POA: Diagnosis not present

## 2024-08-05 ENCOUNTER — Ambulatory Visit (INDEPENDENT_AMBULATORY_CARE_PROVIDER_SITE_OTHER): Admitting: Bariatrics

## 2024-08-05 ENCOUNTER — Encounter: Payer: Self-pay | Admitting: Bariatrics

## 2024-08-05 VITALS — BP 126/84 | HR 82 | Temp 98.1°F | Ht 69.0 in | Wt 361.0 lb

## 2024-08-05 DIAGNOSIS — E559 Vitamin D deficiency, unspecified: Secondary | ICD-10-CM | POA: Diagnosis not present

## 2024-08-05 DIAGNOSIS — E119 Type 2 diabetes mellitus without complications: Secondary | ICD-10-CM | POA: Diagnosis not present

## 2024-08-05 DIAGNOSIS — Z6841 Body Mass Index (BMI) 40.0 and over, adult: Secondary | ICD-10-CM

## 2024-08-05 DIAGNOSIS — Z7985 Long-term (current) use of injectable non-insulin antidiabetic drugs: Secondary | ICD-10-CM

## 2024-08-05 DIAGNOSIS — E1165 Type 2 diabetes mellitus with hyperglycemia: Secondary | ICD-10-CM

## 2024-08-05 DIAGNOSIS — R944 Abnormal results of kidney function studies: Secondary | ICD-10-CM

## 2024-08-05 MED ORDER — VITAMIN D (ERGOCALCIFEROL) 1.25 MG (50000 UNIT) PO CAPS
50000.0000 [IU] | ORAL_CAPSULE | ORAL | 0 refills | Status: AC
Start: 2024-08-05 — End: ?

## 2024-08-05 MED ORDER — DAPAGLIFLOZIN PROPANEDIOL 5 MG PO TABS: 5.0000 mg | ORAL_TABLET | Freq: Every day | ORAL | 0 refills | Status: DC

## 2024-08-05 MED ORDER — TIRZEPATIDE 10 MG/0.5ML ~~LOC~~ SOAJ
10.0000 mg | SUBCUTANEOUS | 0 refills | Status: DC
Start: 2024-08-05 — End: 2024-09-09

## 2024-08-05 NOTE — Progress Notes (Signed)
 WEIGHT SUMMARY AND BIOMETRICS  Weight Lost Since Last Visit: 0  Weight Gained Since Last Visit: 4lb   Vitals Temp: 98.1 F (36.7 C) BP: 126/84 Pulse Rate: 82 SpO2: 99 %   Anthropometric Measurements Height: 5' 9 (1.753 m) Weight: (!) 361 lb (163.7 kg) BMI (Calculated): 53.29 Weight at Last Visit: 357lb Weight Lost Since Last Visit: 0 Weight Gained Since Last Visit: 4lb Starting Weight: 389lb Total Weight Loss (lbs): 28 lb (12.7 kg)   Body Composition  Body Fat %: 49.1 % Fat Mass (lbs): 177.4 lbs Muscle Mass (lbs): 175 lbs Visceral Fat Rating : 37   Other Clinical Data Fasting: yes Labs: no Today's Visit #: 5 Starting Date: 03/27/24    OBESITY Hanz is here to discuss his progress with his obesity treatment plan along with follow-up of his obesity related diagnoses.    Nutrition Plan: the Category 4 plan - 50% adherence.  Current exercise: walking  Interim History:  He is up 4 lb since his last visit.  Eating all of the food on the plan., Protein intake is as prescribed, Is not skipping meals, Not journaling consistently., and Reports polyphagia   Pharmacotherapy: Winferd is on Mounjaro  7.5 mg SQ weekly Adverse side effects: None Hunger is moderately controlled.  Cravings are moderately controlled.  Assessment/Plan:   Type II Diabetes HgbA1c is not at goal, but much improved.  Last A1c was 7.2, but previously 8.7 CBGs: Not checking      Episodes of hypoglycemia: no Medication(s): Mounjaro  7.5 mg SQ weekly  Lab Results  Component Value Date   HGBA1C 7.2 (H) 07/04/2024   HGBA1C 8.7 (H) 03/27/2024   HGBA1C 8.2 (H) 12/08/2023   Lab Results  Component Value Date   LDLCALC 54 07/04/2024   CREATININE 2.50 (H) 07/04/2024   No results found for: GFR  Plan: Continue and refill Mounjaro  10 mg SQ weekly, 500 mg 2 tablets BID daily.   Will keep all carbohydrates low both sweets and starches.  Will continue exercise regimen to 30 to 60 minutes on most days of the week.  Aim for 7 to 9 hours of sleep nightly.  Eat more low glycemic index foods.   Vitamin D  Deficiency Vitamin D  is not at goal of 50.  Most recent vitamin D  level was 48.9. He is on  prescription ergocalciferol  50,000 IU weekly. Lab Results  Component Value Date   VD25OH 48.9 07/04/2024   VD25OH 25.7 (L) 03/27/2024   VD25OH 50 12/08/2020    Plan: Continue prescription vitamin D  50,000 IU weekly.   Decreased GFR:   Her last GFR was decreased, and his BUN and creatinine was increased.    Plan:  Will check a CMP.  He will increase his water and avoid dehydration.     Morbid Obesity: Current BMI BMI (Calculated): 53.29   Pharmacotherapy Plan Continue and increase dose  Mounjaro  10 mg SQ weekly  Roby is currently in the action stage of change. As such, his goal is to continue with weight loss efforts.  He has agreed to the Category 4 plan.  Exercise goals: All adults should avoid inactivity. Some physical activity is better than none, and adults who participate in any amount of physical activity gain some health benefits. He has moved and now has a gym nearby.   Behavioral modification strategies: increasing lean protein intake, decreasing simple carbohydrates , no meal skipping, meal planning , increase water intake, get rid of junk food in the home, keep healthy foods in the home, and mindful eating.  Randle has agreed to follow-up with our clinic in 4 weeks.        Objective:   VITALS: Per patient if applicable, see vitals. GENERAL: Alert and in no acute distress. CARDIOPULMONARY: No increased WOB. Speaking in clear sentences.  PSYCH: Pleasant and cooperative. Speech normal rate and rhythm. Affect is appropriate. Insight and judgement are appropriate. Attention is focused, linear, and appropriate.  NEURO: Oriented as arrived to  appointment on time with no prompting.   Attestation Statements:   This was prepared with the assistance of Engineer, civil (consulting).  Occasional wrong-word or sound-a-like substitutions may have occurred due to the inherent limitations of voice recognition   Clayborne Daring, DO

## 2024-08-06 LAB — COMPREHENSIVE METABOLIC PANEL WITH GFR
ALT: 16 IU/L (ref 0–44)
AST: 20 IU/L (ref 0–40)
Albumin: 4.2 g/dL (ref 4.1–5.1)
Alkaline Phosphatase: 72 IU/L (ref 44–121)
BUN/Creatinine Ratio: 16 (ref 9–20)
BUN: 25 mg/dL — ABNORMAL HIGH (ref 6–24)
Bilirubin Total: 0.7 mg/dL (ref 0.0–1.2)
CO2: 20 mmol/L (ref 20–29)
Calcium: 10.2 mg/dL (ref 8.7–10.2)
Chloride: 100 mmol/L (ref 96–106)
Creatinine, Ser: 1.52 mg/dL — ABNORMAL HIGH (ref 0.76–1.27)
Globulin, Total: 3.1 g/dL (ref 1.5–4.5)
Glucose: 115 mg/dL — ABNORMAL HIGH (ref 70–99)
Potassium: 4.6 mmol/L (ref 3.5–5.2)
Sodium: 136 mmol/L (ref 134–144)
Total Protein: 7.3 g/dL (ref 6.0–8.5)
eGFR: 55 mL/min/1.73 — ABNORMAL LOW (ref >59)

## 2024-08-08 ENCOUNTER — Ambulatory Visit: Payer: Self-pay | Admitting: Bariatrics

## 2024-08-08 ENCOUNTER — Other Ambulatory Visit: Payer: Self-pay | Admitting: Family

## 2024-08-08 DIAGNOSIS — N529 Male erectile dysfunction, unspecified: Secondary | ICD-10-CM

## 2024-08-09 NOTE — Telephone Encounter (Signed)
 Appt scheduled

## 2024-08-09 NOTE — Telephone Encounter (Signed)
 Schedule appointment. Last office visit 12/08/2023.

## 2024-08-30 DIAGNOSIS — E113413 Type 2 diabetes mellitus with severe nonproliferative diabetic retinopathy with macular edema, bilateral: Secondary | ICD-10-CM | POA: Diagnosis not present

## 2024-09-08 ENCOUNTER — Other Ambulatory Visit: Payer: Self-pay | Admitting: Family

## 2024-09-08 DIAGNOSIS — I10 Essential (primary) hypertension: Secondary | ICD-10-CM

## 2024-09-08 DIAGNOSIS — E1165 Type 2 diabetes mellitus with hyperglycemia: Secondary | ICD-10-CM

## 2024-09-09 ENCOUNTER — Ambulatory Visit (INDEPENDENT_AMBULATORY_CARE_PROVIDER_SITE_OTHER): Admitting: Bariatrics

## 2024-09-09 ENCOUNTER — Encounter: Payer: Self-pay | Admitting: Bariatrics

## 2024-09-09 VITALS — BP 123/76 | HR 75 | Temp 97.6°F | Ht 69.0 in | Wt 350.0 lb

## 2024-09-09 DIAGNOSIS — Z6841 Body Mass Index (BMI) 40.0 and over, adult: Secondary | ICD-10-CM

## 2024-09-09 DIAGNOSIS — E1165 Type 2 diabetes mellitus with hyperglycemia: Secondary | ICD-10-CM | POA: Diagnosis not present

## 2024-09-09 DIAGNOSIS — Z7985 Long-term (current) use of injectable non-insulin antidiabetic drugs: Secondary | ICD-10-CM

## 2024-09-09 DIAGNOSIS — E66813 Obesity, class 3: Secondary | ICD-10-CM

## 2024-09-09 DIAGNOSIS — I10 Essential (primary) hypertension: Secondary | ICD-10-CM | POA: Diagnosis not present

## 2024-09-09 MED ORDER — DAPAGLIFLOZIN PROPANEDIOL 5 MG PO TABS: 5.0000 mg | ORAL_TABLET | Freq: Every day | ORAL | 0 refills | Status: DC

## 2024-09-09 MED ORDER — TIRZEPATIDE 10 MG/0.5ML ~~LOC~~ SOAJ: 10.0000 mg | SUBCUTANEOUS | 0 refills | Status: DC

## 2024-09-09 NOTE — Progress Notes (Signed)
 WEIGHT SUMMARY AND BIOMETRICS  Weight Lost Since Last Visit: 11lb  Weight Gained Since Last Visit: 0   Vitals Temp: 97.6 F (36.4 C) BP: 123/76 Pulse Rate: 75 SpO2: 99 %   Anthropometric Measurements Height: 5' 9 (1.753 m) Weight: (!) 350 lb (158.8 kg) BMI (Calculated): 51.66 Weight at Last Visit: 361lb Weight Lost Since Last Visit: 11lb Weight Gained Since Last Visit: 0 Starting Weight: 389lb Total Weight Loss (lbs): 39 lb (17.7 kg)   Body Composition  Body Fat %: 47 % Fat Mass (lbs): 164.5 lbs Muscle Mass (lbs): 177 lbs Visceral Fat Rating : 35   Other Clinical Data Fasting: yes Labs: no Today's Visit #: 6 Starting Date: 03/27/24    OBESITY Vincent Ruiz is here to discuss his progress with his obesity treatment plan along with follow-up of his obesity related diagnoses.    Nutrition Plan: the Category 4 plan - 70% adherence.  Current exercise: walking  Interim History:  He is down another 11 lbs since his last visit.  Eating all of the food on the plan., Is skipping meals, Not journaling consistently., and Water intake is adequate.   Pharmacotherapy: Vincent Ruiz is on Vincent Ruiz  10 mg SQ weekly Adverse side effects: None Hunger is moderately controlled.  Cravings are moderately controlled.  Assessment/Plan:    Type II Diabetes HgbA1c is not at goal. Last A1c was 7.2 CBGs: Fasting around 116      Episodes of hypoglycemia: no Medication(s): Vincent Ruiz  10 mg SQ weekly  Lab Results  Component Value Date   HGBA1C 7.2 (H) 07/04/2024   HGBA1C 8.7 (H) 03/27/2024   HGBA1C 8.2 (H) 12/08/2023   Lab Results  Component Value Date   LDLCALC 54 07/04/2024   CREATININE 1.52 (H) 08/05/2024   No results found for: GFR  Plan: Continue and refill Vincent Ruiz  10 mg SQ weekly Rx: Vincent Ruiz  5 mg daily before breakfast #30 with no refills.  Patient was  instructed to stay well-hydrated and not to take the medication if he is dehydrated for any reason. Continue all other medications.  Will keep all carbohydrates low both sweets and starches.  Will continue exercise regimen to 30 to 60 minutes on most days of the week.  Aim for 7 to 9 hours of sleep nightly.  Eat more low glycemic index foods.   Hypertension Hypertension well controlled.  Medication(s): Amlodipine  5 mg 1 daily  and Hydrochlorothiazide  25 mg daily   BP Readings from Last 3 Encounters:  09/09/24 123/76  08/05/24 126/84  07/04/24 115/71   Lab Results  Component Value Date   CREATININE 1.52 (H) 08/05/2024   CREATININE 2.50 (H) 07/04/2024   CREATININE 1.29 (H) 03/27/2024   No results found for: GFR  Plan: Continue all antihypertensives at current dosages. No added salt. Will keep sodium content to 1,500 mg or less per day.   He will continue to  walk on the treadmill.     Morbid Obesity: Current BMI BMI (Calculated): 51.66   Pharmacotherapy Plan Continue and refill  Vincent Ruiz  10 mg SQ weekly  Vincent Ruiz is currently in the action stage of change. As such, his goal is to continue with weight loss efforts.  He has agreed to the Category 4 plan.  Exercise goals: All adults should avoid inactivity. Some physical activity is better than none, and adults who participate in any amount of physical activity gain some health benefits.  Behavioral modification strategies: planning for success, increasing vegetables, increasing lower sugar fruits, increasing fiber rich foods, ways to avoid night time snacking, avoiding temptations, weigh protein portions, and mindful eating.  Vincent Ruiz has agreed to follow-up with our clinic in 4 weeks.     Objective:   VITALS: Per patient if applicable, see vitals. GENERAL: Alert and in no acute distress. CARDIOPULMONARY: No increased WOB. Speaking in clear sentences.  PSYCH: Pleasant and cooperative. Speech normal rate and rhythm. Affect  is appropriate. Insight and judgement are appropriate. Attention is focused, linear, and appropriate.  NEURO: Oriented as arrived to appointment on time with no prompting.   Attestation Statements:   This was prepared with the assistance of Engineer, civil (consulting).  Occasional wrong-word or sound-a-like substitutions may have occurred due to the inherent limitations of voice recognition   Clayborne Daring, DO

## 2024-09-09 NOTE — Telephone Encounter (Signed)
 Schedule appointment. Last office visit 12/08/2023. During the interim report to the Emergency Department/Urgent Care/call 911 for immediate medical evaluation.

## 2024-09-09 NOTE — Telephone Encounter (Signed)
 Appt scheduled

## 2024-09-10 ENCOUNTER — Other Ambulatory Visit: Payer: Self-pay | Admitting: Family

## 2024-09-10 ENCOUNTER — Other Ambulatory Visit: Payer: Self-pay | Admitting: Bariatrics

## 2024-09-10 DIAGNOSIS — I10 Essential (primary) hypertension: Secondary | ICD-10-CM

## 2024-09-10 NOTE — Telephone Encounter (Signed)
 Schedule appointment. Last office visit 12/08/2023. During the interim report to the Emergency Department/Urgent Care/call 911 for immediate medical evaluation.

## 2024-09-16 ENCOUNTER — Ambulatory Visit: Admitting: Family

## 2024-09-20 ENCOUNTER — Encounter: Payer: Self-pay | Admitting: Family Medicine

## 2024-09-20 ENCOUNTER — Ambulatory Visit: Admitting: Family Medicine

## 2024-09-20 VITALS — BP 117/72 | HR 79 | Temp 98.8°F | Resp 18 | Ht 70.0 in | Wt 356.0 lb

## 2024-09-20 DIAGNOSIS — Z7984 Long term (current) use of oral hypoglycemic drugs: Secondary | ICD-10-CM

## 2024-09-20 DIAGNOSIS — E1165 Type 2 diabetes mellitus with hyperglycemia: Secondary | ICD-10-CM

## 2024-09-20 DIAGNOSIS — Z23 Encounter for immunization: Secondary | ICD-10-CM | POA: Diagnosis not present

## 2024-09-20 DIAGNOSIS — N529 Male erectile dysfunction, unspecified: Secondary | ICD-10-CM | POA: Diagnosis not present

## 2024-09-20 DIAGNOSIS — I10 Essential (primary) hypertension: Secondary | ICD-10-CM

## 2024-09-20 DIAGNOSIS — Z7689 Persons encountering health services in other specified circumstances: Secondary | ICD-10-CM | POA: Diagnosis not present

## 2024-09-20 MED ORDER — SILDENAFIL CITRATE 100 MG PO TABS: ORAL_TABLET | ORAL | 0 refills | Status: DC

## 2024-09-20 MED ORDER — METFORMIN HCL 500 MG PO TABS: 1000.0000 mg | ORAL_TABLET | Freq: Two times a day (BID) | ORAL | 1 refills | Status: AC

## 2024-09-20 MED ORDER — HYDROCHLOROTHIAZIDE 25 MG PO TABS: 25.0000 mg | ORAL_TABLET | Freq: Every day | ORAL | 1 refills | Status: DC

## 2024-09-20 MED ORDER — AMLODIPINE BESYLATE 5 MG PO TABS: 5.0000 mg | ORAL_TABLET | Freq: Every day | ORAL | 1 refills | Status: DC

## 2024-09-20 NOTE — Assessment & Plan Note (Addendum)
 sildenafil  100 mg 1 hour before intercourse. Symptoms are well controlled with current regimen. Refill sent.

## 2024-09-20 NOTE — Assessment & Plan Note (Signed)
 Taking amlodipine  5 mg daily, hydrochlorothiazide  25 mg daily Denies chest pain, shortness of breath, vision changes, headaches. Well controlled in the office upon recheck. 08/05/24: GFR 55, improved from 31. K+ 4.6 Refills sent. Follow-up  6 months, sooner if anything changes.

## 2024-09-20 NOTE — Progress Notes (Signed)
 New Patient Office Visit  Subjective    Patient ID: Vincent Ruiz, male    DOB: 1971/07/28  Age: 53 y.o. MRN: [id number]  CC:  Chief Complaint  Patient presents with   Establish Care    Patient is here to establish care with a new PCP    HPI Vincent Ruiz presents to establish care with this practice. He is new to me.   HTN: amlodipine  5 mg daily, hydrochlorothiazide  25 mg daily Denies chest pain, shortness of breath, vision changes, headaches. Well controlled. 08/05/24: GFR 55, improved from 31. K+ 4.6 HLD:  atorvastatin  20 mg daily DM: Farxiga  5 mg daily, metformin  1000 mg BID 07/04/24: A1C 7.2, improved from 8.7 Obesity: tirzepatide  10 mg weekly ([facility]) Vitamin D  50,000 international units weekly ([facility]) ED: sildenafil  100 mg 1 hour before intercourse. GERD: omeprazole  20 mg prn GAD: buspirone 7.5 mg daily, lamotrigine  150 mg daily per psychiatry    Outpatient Encounter Medications as of 09/20/2024  Medication Sig   atorvastatin  (LIPITOR) 20 MG tablet TAKE 1 TABLET BY MOUTH EVERY DAY   busPIRone (BUSPAR) 7.5 MG tablet Take 7.5 mg by mouth 3 (three) times daily.   dapagliflozin  propanediol (FARXIGA ) 5 MG TABS tablet Take 1 tablet (5 mg total) by mouth daily before breakfast.   diclofenac (VOLTAREN) 75 MG EC tablet Take 75 mg by mouth 2 (two) times daily.   lamoTRIgine  (LAMICTAL ) 150 MG tablet Take 150 mg by mouth daily.   omeprazole  (PRILOSEC) 20 MG capsule TAKE 1 CAPSULE BY MOUTH EVERY DAY   sucralfate  (CARAFATE ) 1 g tablet Take 1 tablet (1 g total) by mouth 2 (two) times daily.   tirzepatide  (MOUNJARO ) 10 MG/0.5ML Pen Inject 10 mg into the skin once a week.   tobramycin (TOBREX) 0.3 % ophthalmic solution Place 1 drop into both eyes every 6 (six) hours.   Vitamin D , Ergocalciferol , (DRISDOL ) 1.25 MG (50000 UNIT) CAPS capsule Take 1 capsule (50,000 Units total) by mouth every 7 (seven) days.   [DISCONTINUED] amLODipine  (NORVASC ) 5 MG tablet Take 1 tablet by mouth  daily.   [DISCONTINUED] amLODipine  (NORVASC ) 5 MG tablet TAKE 1 TABLET (5 MG TOTAL) BY MOUTH DAILY.   [DISCONTINUED] hydrochlorothiazide  (HYDRODIURIL ) 25 MG tablet TAKE 1 TABLET (25 MG TOTAL) BY MOUTH DAILY.   [DISCONTINUED] metFORMIN  (GLUCOPHAGE ) 500 MG tablet TAKE 2 TABLETS (1,000 MG TOTAL) BY MOUTH 2 (TWO) TIMES DAILY WITH A MEAL.   [DISCONTINUED] sildenafil  (VIAGRA ) 100 MG tablet TAKE 1 TABLET 1/2 HOUR TO 1 HOUR PRIOR TO INTERCOURSE AS NEEDED. LIMIT USE TO 1/2 TABLET OR 1 TABLET PER 24 HOURS.   amLODipine  (NORVASC ) 5 MG tablet Take 1 tablet (5 mg total) by mouth daily.   hydrochlorothiazide  (HYDRODIURIL ) 25 MG tablet Take 1 tablet (25 mg total) by mouth daily.   metFORMIN  (GLUCOPHAGE ) 500 MG tablet Take 2 tablets (1,000 mg total) by mouth 2 (two) times daily with a meal.   sildenafil  (VIAGRA ) 100 MG tablet Take 1 tablet 1/2 hour to 1 hour prior to intercourse as needed. Limit use to 1/2 tablet or 1 tablet per 24 hours.   No facility-administered encounter medications on file as of 09/20/2024.    Past Medical History:  Diagnosis Date   Anxiety    Asthma    Bipolar 1 disorder (HCC)    Depression    Diabetes mellitus without complication (HCC)    Fatty liver    GERD (gastroesophageal reflux disease)    Hypertension    Hypothyroidism  Multiple food allergies    Sleep apnea    Swelling of lower extremity    Vitamin D  deficiency     Past Surgical History:  Procedure Laterality Date   KNEE SURGERY Left 2005    Family History  Problem Relation Age of Onset   Diabetes Mother    Hypertension Mother    Heart disease Mother    Obesity Mother    High blood pressure Father    High Cholesterol Father    Alcoholism Father    Obesity Father    Multiple myeloma Sister     Social History   Socioeconomic History   Marital status: Married    Spouse name: Not on file   Number of children: Not on file   Years of education: Not on file   Highest education level: Some college, no  degree  Occupational History   Not on file  Tobacco Use   Smoking status: Never    Passive exposure: Never   Smokeless tobacco: Never  Vaping Use   Vaping status: Never Used  Substance and Sexual Activity   Alcohol use: No   Drug use: No   Sexual activity: Not Currently  Other Topics Concern   Not on file  Social History Narrative   Separated since 2019,married for 6 years.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   Social Drivers of Health   Financial Resource Strain: High Risk (12/04/2023)   Overall Financial Resource Strain (CARDIA)    Difficulty of Paying Living Expenses: Very hard  Food Insecurity: Food Insecurity Present (12/04/2023)   Hunger Vital Sign    Worried About Running Out of Food in the Last Year: Sometimes true    Ran Out of Food in the Last Year: Sometimes true  Transportation Needs: Unmet Transportation Needs (12/04/2023)   PRAPARE - Administrator, Civil Service (Medical): Yes    Lack of Transportation (Non-Medical): Yes  Physical Activity: Insufficiently Active (12/04/2023)   Exercise Vital Sign    Days of Exercise per Week: 2 days    Minutes of Exercise per Session: 20 min  Stress: Stress Concern Present (12/04/2023)   [employer] of Occupational Health - Occupational Stress Questionnaire    Feeling of Stress : To some extent  Social Connections: Unknown (12/04/2023)   Social Connection and Isolation Panel    Frequency of Communication with Friends and Family: Patient declined    Frequency of Social Gatherings with Friends and Family: Never    Attends Religious Services: Never    Diplomatic Services operational officer: Patient declined    Attends Engineer, structural: Not on file    Marital Status: Separated  Intimate Partner Violence: Not on file    ROS      Objective    BP 117/72 (Patient Position: Sitting, Cuff Size: Large)   Pulse 79   Temp 98.8 F (37.1 C) (Oral)   Resp 18   Ht 5' 10 (1.778 m)   Wt (!) 356  lb (161.5 kg)   SpO2 97%   BMI 51.08 kg/m   Physical Exam Vitals and nursing note reviewed.  Constitutional:      General: He is not in acute distress.    Appearance: Normal appearance.  Cardiovascular:     Rate and Rhythm: Normal rate and regular rhythm.     Heart sounds: Normal heart sounds.  Pulmonary:     Effort: Pulmonary effort is normal.     Breath sounds: Normal breath sounds.  Skin:  General: Skin is warm and dry.  Neurological:     General: No focal deficit present.     Mental Status: He is alert. Mental status is at baseline.         Assessment & Plan:   Establishing care with new doctor, encounter for  Immunization due -     Flu vaccine trivalent PF, 6mos and older(Flulaval,Afluria,Fluarix,Fluzone)  Primary hypertension Assessment & Plan: Taking amlodipine  5 mg daily, hydrochlorothiazide  25 mg daily Denies chest pain, shortness of breath, vision changes, headaches. Well controlled in the office upon recheck. 08/05/24: GFR 55, improved from 31. K+ 4.6 Refills sent. Follow-up  6 months, sooner if anything changes.    Orders: -     amLODIPine  Besylate; Take 1 tablet (5 mg total) by mouth daily.  Dispense: 90 tablet; Refill: 1 -     hydroCHLOROthiazide ; Take 1 tablet (25 mg total) by mouth daily.  Dispense: 90 tablet; Refill: 1  Type 2 diabetes mellitus with hyperglycemia, without long-term current use of insulin  (HCC) Assessment & Plan: DM: Farxiga  5 mg daily, metformin  1000 mg BID 07/04/24: A1C 7.2, improved from 8.7 Refills sent. A1C due 10/05/24 Follow-up in 6 months.    Orders: -     metFORMIN  HCl; Take 2 tablets (1,000 mg total) by mouth 2 (two) times daily with a meal.  Dispense: 360 tablet; Refill: 1  Erectile dysfunction, unspecified erectile dysfunction type Assessment & Plan: sildenafil  100 mg 1 hour before intercourse. Symptoms are well controlled with current regimen. Refill sent.   Orders: -     Sildenafil  Citrate; Take 1  tablet 1/2 hour to 1 hour prior to intercourse as needed. Limit use to 1/2 tablet or 1 tablet per 24 hours.  Dispense: 30 tablet; Refill: 0    Agrees with plan of care discussed.  Questions answered.   Return in about 6 months (around 03/21/2025) for HTN, DM, HLD .   Darice JONELLE Brownie, FNP

## 2024-09-20 NOTE — Assessment & Plan Note (Addendum)
 DM: Farxiga  5 mg daily, metformin  1000 mg BID 07/04/24: A1C 7.2, improved from 8.7 Refills sent. A1C due 10/05/24 Follow-up in 6 months.

## 2024-09-27 DIAGNOSIS — E113413 Type 2 diabetes mellitus with severe nonproliferative diabetic retinopathy with macular edema, bilateral: Secondary | ICD-10-CM | POA: Diagnosis not present

## 2024-09-27 DIAGNOSIS — E133413 Other specified diabetes mellitus with severe nonproliferative diabetic retinopathy with macular edema, bilateral: Secondary | ICD-10-CM | POA: Diagnosis not present

## 2024-10-02 ENCOUNTER — Other Ambulatory Visit: Payer: Self-pay | Admitting: Bariatrics

## 2024-10-07 ENCOUNTER — Other Ambulatory Visit: Payer: Self-pay | Admitting: Family

## 2024-10-07 DIAGNOSIS — E785 Hyperlipidemia, unspecified: Secondary | ICD-10-CM

## 2024-10-09 ENCOUNTER — Encounter: Payer: Self-pay | Admitting: Bariatrics

## 2024-10-09 ENCOUNTER — Ambulatory Visit: Admitting: Bariatrics

## 2024-10-09 VITALS — BP 150/78 | HR 74 | Ht 69.0 in | Wt 352.0 lb

## 2024-10-09 DIAGNOSIS — Z7985 Long-term (current) use of injectable non-insulin antidiabetic drugs: Secondary | ICD-10-CM

## 2024-10-09 DIAGNOSIS — E1165 Type 2 diabetes mellitus with hyperglycemia: Secondary | ICD-10-CM

## 2024-10-09 DIAGNOSIS — E785 Hyperlipidemia, unspecified: Secondary | ICD-10-CM

## 2024-10-09 DIAGNOSIS — E119 Type 2 diabetes mellitus without complications: Secondary | ICD-10-CM | POA: Diagnosis not present

## 2024-10-09 DIAGNOSIS — Z6841 Body Mass Index (BMI) 40.0 and over, adult: Secondary | ICD-10-CM | POA: Diagnosis not present

## 2024-10-09 MED ORDER — TIRZEPATIDE 12.5 MG/0.5ML ~~LOC~~ SOAJ: 12.5000 mg | SUBCUTANEOUS | 0 refills | Status: DC

## 2024-10-09 NOTE — Progress Notes (Signed)
 WEIGHT SUMMARY AND BIOMETRICS  Weight Lost Since Last Visit: 0  Weight Gained Since Last Visit: 2lb   Vitals Temp: -- (Could not obtain) BP: (!) 150/78 Pulse Rate: 74 SpO2: 95 %   Anthropometric Measurements Height: 5' 9 (1.753 m) Weight: (!) 352 lb (159.7 kg) BMI (Calculated): 51.96 Weight at Last Visit: 350lb Weight Lost Since Last Visit: 0 Weight Gained Since Last Visit: 2lb Starting Weight: 389lb Total Weight Loss (lbs): 37 lb (16.8 kg)   Body Composition  Body Fat %: 47.8 % Fat Mass (lbs): 168.6 lbs Muscle Mass (lbs): 175.2 lbs Visceral Fat Rating : 35   Other Clinical Data Fasting: no Labs: no Today's Visit #: 7 Starting Date: 03/27/24    OBESITY Vincent Ruiz is here to discuss his progress with his obesity treatment plan along with follow-up of his obesity related diagnoses.    Nutrition Plan: the Category 4 plan - 40-50% adherence.  Current exercise: walking  Interim History:  He is up 2 lbs since his last visit. He has been to more celebrations. He drinking more water.  Eating all of the food on the plan., Protein intake is as prescribed, and Water intake is adequate.   Pharmacotherapy: Vincent Ruiz is on Mounjaro  10 mg SQ weekly Adverse side effects: None Hunger is moderately controlled.  Cravings are moderately controlled.  Assessment/Plan:   Type II Diabetes HgbA1c is not at goal. Last A1c was 7.2 CBGs: not checking on a regular basis. Episodes of hypoglycemia: no Medication(s): Mounjaro  10 mg SQ weekly  Lab Results  Component Value Date   HGBA1C 7.2 (H) 07/04/2024   HGBA1C 8.7 (H) 03/27/2024   HGBA1C 8.2 (H) 12/08/2023   Lab Results  Component Value Date   LDLCALC 54 07/04/2024   CREATININE 1.52 (H) 08/05/2024   No results found for: GFR  Plan: Continue and increase dose Mounjaro  12.5 mg SQ weekly Continue all other  medications.  Discussed diabetes in general and all medications.  Will keep all carbohydrates low both sweets and starches.  Will continue exercise regimen to 30 to 60 minutes on most days of the week.  Aim for 7 to 9 hours of sleep nightly.  Eat more low glycemic index foods.   Hyperlipidemia LDL is at goal. Medication(s): Lipitor Cardiovascular risk factors: diabetes mellitus, male gender, obesity (BMI >= 30 kg/m2), and sedentary lifestyle  Lab Results  Component Value Date   CHOL 110 07/04/2024   HDL 40 07/04/2024   LDLCALC 54 07/04/2024   TRIG 77 07/04/2024   CHOLHDL 4.2 12/08/2023   Lab Results  Component Value Date   ALT 16 08/05/2024   AST 20 08/05/2024   ALKPHOS 72 08/05/2024   BILITOT 0.7 08/05/2024   The ASCVD Risk score (Arnett DK, et al., 2019) failed to calculate for the following reasons:   The valid total cholesterol range is 130 to 320  mg/dL  Plan:  Continue statin.  Information sheet on healthy vs unhealthy fats.  Will avoid all trans fats.  Will read labels Will minimize saturated fats except the following: low fat meats in moderation, diary, and limited dark chocolate.  Will continue his water intake at greater than 80 ounces.    Morbid Obesity: Current BMI BMI (Calculated): 51.96   Pharmacotherapy Plan Continue and increase dose  Mounjaro  12.5 mg SQ weekly  Vincent Ruiz is currently in the action stage of change. As such, his goal is to continue with weight loss efforts.  He has agreed to the Category 4 plan.  Exercise goals: For substantial health benefits, adults should do at least 150 minutes (2 hours and 30 minutes) a week of moderate-intensity, or 75 minutes (1 hour and 15 minutes) a week of vigorous-intensity aerobic physical activity, or an equivalent combination of moderate- and vigorous-intensity aerobic activity. Aerobic activity should be performed in episodes of at least 10 minutes, and preferably, it should be spread throughout the  week.  Behavioral modification strategies: increasing lean protein intake, meal planning , increase water intake, better snacking choices, planning for success, increasing vegetables, increase frequency of journaling, celebration eating strategies, measure portion sizes, and mindful eating.  Vincent Ruiz has agreed to follow-up with our clinic in 4 weeks.    Objective:   VITALS: Per patient if applicable, see vitals. GENERAL: Alert and in no acute distress. CARDIOPULMONARY: No increased WOB. Speaking in clear sentences.  PSYCH: Pleasant and cooperative. Speech normal rate and rhythm. Affect is appropriate. Insight and judgement are appropriate. Attention is focused, linear, and appropriate.  NEURO: Oriented as arrived to appointment on time with no prompting.   Attestation Statements:   This was prepared with the assistance of Engineer, civil (consulting).  Occasional wrong-word or sound-a-like substitutions may have occurred due to the inherent limitations of voice recognition   Clayborne Daring, DO

## 2024-10-18 ENCOUNTER — Ambulatory Visit: Admitting: Family

## 2024-10-21 DIAGNOSIS — F3181 Bipolar II disorder: Secondary | ICD-10-CM | POA: Diagnosis not present

## 2024-10-29 DIAGNOSIS — E133413 Other specified diabetes mellitus with severe nonproliferative diabetic retinopathy with macular edema, bilateral: Secondary | ICD-10-CM | POA: Diagnosis not present

## 2024-10-29 DIAGNOSIS — H2513 Age-related nuclear cataract, bilateral: Secondary | ICD-10-CM | POA: Diagnosis not present

## 2024-10-29 DIAGNOSIS — E113413 Type 2 diabetes mellitus with severe nonproliferative diabetic retinopathy with macular edema, bilateral: Secondary | ICD-10-CM | POA: Diagnosis not present

## 2024-11-06 ENCOUNTER — Encounter: Payer: Self-pay | Admitting: Bariatrics

## 2024-11-06 ENCOUNTER — Ambulatory Visit: Admitting: Bariatrics

## 2024-11-06 VITALS — BP 149/89 | HR 84 | Temp 98.1°F | Ht 69.0 in | Wt 353.0 lb

## 2024-11-06 DIAGNOSIS — Z6841 Body Mass Index (BMI) 40.0 and over, adult: Secondary | ICD-10-CM

## 2024-11-06 DIAGNOSIS — E1165 Type 2 diabetes mellitus with hyperglycemia: Secondary | ICD-10-CM | POA: Diagnosis not present

## 2024-11-06 DIAGNOSIS — E66813 Obesity, class 3: Secondary | ICD-10-CM

## 2024-11-06 DIAGNOSIS — Z7985 Long-term (current) use of injectable non-insulin antidiabetic drugs: Secondary | ICD-10-CM

## 2024-11-06 DIAGNOSIS — E669 Obesity, unspecified: Secondary | ICD-10-CM

## 2024-11-06 DIAGNOSIS — I10 Essential (primary) hypertension: Secondary | ICD-10-CM

## 2024-11-06 MED ORDER — TIRZEPATIDE 12.5 MG/0.5ML ~~LOC~~ SOAJ
12.5000 mg | SUBCUTANEOUS | 0 refills | Status: AC
Start: 2024-11-06 — End: ?

## 2024-11-06 MED ORDER — DAPAGLIFLOZIN PROPANEDIOL 5 MG PO TABS
5.0000 mg | ORAL_TABLET | Freq: Every day | ORAL | 0 refills | Status: AC
Start: 2024-11-06 — End: ?

## 2024-11-06 NOTE — Progress Notes (Signed)
 WEIGHT SUMMARY AND BIOMETRICS  Weight Lost Since Last Visit: 0  Weight Gained Since Last Visit: 1lb   Vitals Temp: 98.1 F (36.7 C) BP: (!) 149/89 Pulse Rate: 84 SpO2: 97 %   Anthropometric Measurements Height: 5' 9 (1.753 m) Weight: (!) 353 lb (160.1 kg) BMI (Calculated): 52.11 Weight at Last Visit: 352lb Weight Lost Since Last Visit: 0 Weight Gained Since Last Visit: 1lb Starting Weight: 389lb Total Weight Loss (lbs): 36 lb (16.3 kg)   Body Composition  Body Fat %: 39.9 % Fat Mass (lbs): 141 lbs Muscle Mass (lbs): 202.2 lbs Total Body Water (lbs): 171 lbs Visceral Fat Rating : 30   Other Clinical Data Fasting: no Labs: no Today's Visit #: 8 Starting Date: 03/27/24    OBESITY Tandy is here to discuss his progress with his obesity treatment plan along with follow-up of his obesity related diagnoses.    Nutrition Plan: the Category 4 plan - 50% adherence.  Current exercise: walking  Interim History:  He is up 1 lb Eating all of the food on the plan., Protein intake is as prescribed, Is not skipping meals, and Meeting protein goals.   Pharmacotherapy: Tejas is on Mounjaro  12.5 mg SQ weekly Adverse side effects: None Hunger is moderately controlled.  Cravings are moderately controlled.  Assessment/Plan:   Type II Diabetes HgbA1c is not at goal. Last A1c was 7.2 CBGs: Not checking      Episodes of hypoglycemia: no Medication(s): Mounjaro  12.5 mg SQ weekly  Lab Results  Component Value Date   HGBA1C 7.2 (H) 07/04/2024   HGBA1C 8.7 (H) 03/27/2024   HGBA1C 8.2 (H) 12/08/2023   Lab Results  Component Value Date   LDLCALC 54 07/04/2024   CREATININE 1.52 (H) 08/05/2024   No results found for: GFR  Plan: Continue and refill Mounjaro  12.5 mg SQ weekly Continue all other medications.  Will keep all carbohydrates low both sweets  and starches.  Will continue exercise regimen to 30 to 60 minutes on most days of the week.  Discussed healthy fats and handout given.  Discussed increasing her fiber.   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 He is taking his medications as directed.   Medication(s): Amlodipine  5 mg 1 daily  and Hydrochlorothiazide  25 mg daily   BP Readings from Last 3 Encounters:  11/06/24 (!) 149/89  10/09/24 (!) 150/78  09/20/24 117/72   Lab Results  Component Value Date   CREATININE 1.52 (H) 08/05/2024   CREATININE 2.50 (H) 07/04/2024   CREATININE 1.29 (H) 03/27/2024   No results found for: GFR  Plan: Continue all antihypertensives at current dosages. No added salt. Will keep sodium content to 1,500 mg or less per day.     Generalized Obesity: Current BMI BMI (Calculated): 52.11   Pharmacotherapy Plan Continue and refill  Mounjaro  12.5 mg SQ  weekly  Andersen is currently in the action stage of change. As such, his goal is to continue with weight loss efforts.  He has agreed to the Category 4 plan.  Exercise goals: All adults should avoid inactivity. Some physical activity is better than none, and adults who participate in any amount of physical activity gain some health benefits.  Behavioral modification strategies: increasing lean protein intake, decreasing simple carbohydrates , meal planning , increase water intake, better snacking choices, planning for success, increasing fiber rich foods, and keep healthy foods in the home.  Giuseppe has agreed to follow-up and labs with our clinic in 4 weeks.      Objective:   VITALS: Per patient if applicable, see vitals. GENERAL: Alert and in no acute distress. CARDIOPULMONARY: No increased WOB. Speaking in clear sentences.  PSYCH: Pleasant and cooperative. Speech normal rate and rhythm. Affect is appropriate. Insight and judgement are appropriate. Attention is focused, linear, and appropriate.   NEURO: Oriented as arrived to appointment on time with no prompting.   Attestation Statements:   This was prepared with the assistance of Engineer, Civil (consulting).  Occasional wrong-word or sound-a-like substitutions may have occurred due to the inherent limitations of voice recognition   Clayborne Daring, DO

## 2024-12-03 LAB — OPHTHALMOLOGY REPORT-SCANNED

## 2024-12-04 ENCOUNTER — Encounter: Payer: Self-pay | Admitting: Bariatrics

## 2024-12-04 ENCOUNTER — Ambulatory Visit: Admitting: Bariatrics

## 2024-12-04 VITALS — BP 119/69 | HR 75 | Ht 69.0 in | Wt 345.0 lb

## 2024-12-04 DIAGNOSIS — E559 Vitamin D deficiency, unspecified: Secondary | ICD-10-CM | POA: Diagnosis not present

## 2024-12-04 DIAGNOSIS — Z6841 Body Mass Index (BMI) 40.0 and over, adult: Secondary | ICD-10-CM

## 2024-12-04 DIAGNOSIS — E785 Hyperlipidemia, unspecified: Secondary | ICD-10-CM

## 2024-12-04 DIAGNOSIS — E119 Type 2 diabetes mellitus without complications: Secondary | ICD-10-CM | POA: Diagnosis not present

## 2024-12-04 DIAGNOSIS — E1165 Type 2 diabetes mellitus with hyperglycemia: Secondary | ICD-10-CM

## 2024-12-04 DIAGNOSIS — Z7985 Long-term (current) use of injectable non-insulin antidiabetic drugs: Secondary | ICD-10-CM

## 2024-12-04 DIAGNOSIS — E66813 Obesity, class 3: Secondary | ICD-10-CM

## 2024-12-04 MED ORDER — DAPAGLIFLOZIN PROPANEDIOL 5 MG PO TABS: 5.0000 mg | ORAL_TABLET | Freq: Every day | ORAL | 0 refills | Status: DC

## 2024-12-04 MED ORDER — TIRZEPATIDE 12.5 MG/0.5ML ~~LOC~~ SOAJ: 12.5000 mg | SUBCUTANEOUS | 0 refills | Status: AC

## 2024-12-04 MED ORDER — VITAMIN D (ERGOCALCIFEROL) 1.25 MG (50000 UNIT) PO CAPS: 50000.0000 [IU] | ORAL_CAPSULE | ORAL | 0 refills | Status: AC

## 2024-12-04 NOTE — Progress Notes (Signed)
 WEIGHT SUMMARY AND BIOMETRICS  Weight Lost Since Last Visit: 8lb  Weight Gained Since Last Visit: 0   Vitals BP: 119/69 Pulse Rate: 75 SpO2: 96 %   Anthropometric Measurements Height: 5' 9 (1.753 m) Weight: (!) 345 lb (156.5 kg) BMI (Calculated): 50.92 Weight at Last Visit: 353lb Weight Lost Since Last Visit: 8lb Weight Gained Since Last Visit: 0 Starting Weight: 389lb Total Weight Loss (lbs): 44 lb (20 kg)   Body Composition  Body Fat %: 46.8 % Fat Mass (lbs): 161.8 lbs Muscle Mass (lbs): 174.8 lbs Visceral Fat Rating : 34   Other Clinical Data Fasting: no Labs: no Today's Visit #: 9 Starting Date: 03/27/24    OBESITY Vincent Ruiz is here to discuss his progress with his obesity treatment plan along with follow-up of his obesity related diagnoses.    Nutrition Plan: the Category 4 plan - 60% adherence.  Current exercise: walking  Interim History:  He is down 8 lbs. He is eating his protein and minimizing carbohydrates. He is staying better hydrated.  Eating all of the food on the plan., Protein intake is as prescribed, Is not skipping meals, and Water intake is adequate.   Pharmacotherapy: Vincent Ruiz is on Mounjaro  12.5 mg SQ weekly Adverse side effects: None Hunger is well controlled.  Cravings are moderately controlled.  Assessment/Plan:   Type II Diabetes HgbA1c is not at goal. Last A1c was 7.2 CBGs: Not checking      Episodes of hypoglycemia: no Medication(s): Mounjaro  12.5 mg SQ weekly  Lab Results  Component Value Date   HGBA1C 7.2 (H) 07/04/2024   HGBA1C 8.7 (H) 03/27/2024   HGBA1C 8.2 (H) 12/08/2023   Lab Results  Component Value Date   LDLCALC 54 07/04/2024   CREATININE 1.52 (H) 08/05/2024   No results found for: GFR  Plan: Continue and refill Mounjaro  12.5 mg SQ weekly Continue all other medications.  Will keep all  carbohydrates low both sweets and starches.  Will continue exercise regimen to 30 to 60 minutes on most days of the week.  Aim for 7 to 9 hours of sleep nightly.  Eat more low glycemic index foods.  Will pack his lunch for work.   Hyperlipidemia LDL is at goal. Medication(s): Lipitor Cardiovascular risk factors: male gender, obesity (BMI >= 30 kg/m2), and sedentary lifestyle  Lab Results  Component Value Date   CHOL 110 07/04/2024   HDL 40 07/04/2024   LDLCALC 54 07/04/2024   TRIG 77 07/04/2024   CHOLHDL 4.2 12/08/2023   Lab Results  Component Value Date   ALT 16 08/05/2024   AST 20 08/05/2024   ALKPHOS 72 08/05/2024   BILITOT 0.7 08/05/2024   The ASCVD Risk score (Arnett DK, et al., 2019) failed to calculate for the following reasons:   The valid total cholesterol range is 130 to 320 mg/dL  Plan:  Continue statin.  Will avoid all trans fats.  Will read labels Will minimize saturated fats except the following: low fat meats in moderation, diary, and limited dark chocolate.  He will stay well-hydrated at work. Will continue to be active at work and will continue his walking for cardio.   Vitamin D  Deficiency Vitamin D  is not at goal of 50.  Most recent vitamin D  level was 48.9. He is on  prescription ergocalciferol  50,000 IU weekly. Lab Results  Component Value Date   VD25OH 48.9 07/04/2024   VD25OH 25.7 (L) 03/27/2024   VD25OH 50 12/08/2020    Plan: Refill prescription vitamin D  50,000 IU weekly.   He will have his routine labs done via his PCP and we can review them at his next visit.   Morbid Obesity: Current BMI BMI (Calculated): 50.92   Pharmacotherapy Plan Continue and refill  Mounjaro  12.5 mg SQ weekly  Vincent Ruiz is currently in the action stage of change. As such, his goal is to continue with weight loss efforts.  He has agreed to the Category 4 plan.  Exercise goals: For substantial health benefits, adults should do at least 150 minutes (2 hours and 30  minutes) a week of moderate-intensity, or 75 minutes (1 hour and 15 minutes) a week of vigorous-intensity aerobic physical activity, or an equivalent combination of moderate- and vigorous-intensity aerobic activity. Aerobic activity should be performed in episodes of at least 10 minutes, and preferably, it should be spread throughout the week.  Behavioral modification strategies: increasing lean protein intake, decreasing simple carbohydrates , no meal skipping, meal planning , better snacking choices, planning for success, avoiding temptations, keep healthy foods in the home, increase frequency of journaling, weigh protein portions, and mindful eating.  Vincent Ruiz has agreed to follow-up with our clinic in 4 weeks.    Objective:   VITALS: Per patient if applicable, see vitals. GENERAL: Alert and in no acute distress. CARDIOPULMONARY: No increased WOB. Speaking in clear sentences.  PSYCH: Pleasant and cooperative. Speech normal rate and rhythm. Affect is appropriate. Insight and judgement are appropriate. Attention is focused, linear, and appropriate.  NEURO: Oriented as arrived to appointment on time with no prompting.   Attestation Statements:   This was prepared with the assistance of Engineer, Civil (consulting).  Occasional wrong-word or sound-a-like substitutions may have occurred due to the inherent limitations of voice recognition.  Clayborne Daring, DO

## 2024-12-09 ENCOUNTER — Other Ambulatory Visit: Payer: Self-pay | Admitting: Family Medicine

## 2024-12-09 DIAGNOSIS — N529 Male erectile dysfunction, unspecified: Secondary | ICD-10-CM

## 2024-12-16 ENCOUNTER — Encounter: Payer: Self-pay | Admitting: Family

## 2024-12-17 ENCOUNTER — Encounter: Admitting: Family Medicine

## 2024-12-17 ENCOUNTER — Encounter: Payer: Self-pay | Admitting: Family Medicine

## 2024-12-17 VITALS — BP 118/70 | HR 68 | Ht 70.0 in | Wt 350.0 lb

## 2024-12-17 DIAGNOSIS — Z13228 Encounter for screening for other metabolic disorders: Secondary | ICD-10-CM | POA: Diagnosis not present

## 2024-12-17 DIAGNOSIS — Z1159 Encounter for screening for other viral diseases: Secondary | ICD-10-CM | POA: Diagnosis not present

## 2024-12-17 DIAGNOSIS — Z136 Encounter for screening for cardiovascular disorders: Secondary | ICD-10-CM | POA: Diagnosis not present

## 2024-12-17 DIAGNOSIS — Z13220 Encounter for screening for lipoid disorders: Secondary | ICD-10-CM | POA: Insufficient documentation

## 2024-12-17 DIAGNOSIS — Z130 Encounter for screening for diseases of the blood and blood-forming organs and certain disorders involving the immune mechanism: Secondary | ICD-10-CM | POA: Insufficient documentation

## 2024-12-17 DIAGNOSIS — Z0000 Encounter for general adult medical examination without abnormal findings: Secondary | ICD-10-CM | POA: Insufficient documentation

## 2024-12-17 DIAGNOSIS — Z1211 Encounter for screening for malignant neoplasm of colon: Secondary | ICD-10-CM | POA: Diagnosis not present

## 2024-12-17 NOTE — Progress Notes (Signed)
 "  Complete physical exam  Patient: Vincent Ruiz   DOB: 1971-03-27   53 y.o. Male  MRN: [id number]  Subjective:    Chief Complaint  Patient presents with   Annual Exam    Vincent Ruiz is a 53 y.o. male who presents today for a complete physical exam. He reports consuming a high protein  diet. Walking 150 minutes per week.  He generally feels well. He reports sleeping well. He does not have additional problems to discuss today.    Most recent fall risk assessment:    12/17/2024    8:29 AM  Fall Risk   Falls in the past year? 0  Number falls in past yr: 0  Injury with Fall? 0     Most recent depression screenings:    12/17/2024    8:25 AM 09/20/2024    8:31 AM  PHQ 2/9 Scores  PHQ - 2 Score 0 0  PHQ- 9 Score 0 0      Data saved with a previous flowsheet row definition    Vision:Within last year and Dental: No current dental problems and Receives regular dental care    Patient Care Team: Booker Darice SAUNDERS, FNP as PCP - General (Family Medicine)   Show/hide medication list[1]  ROS        Objective:     BP 118/70 (Patient Position: Sitting, Cuff Size: Large)   Pulse 68   Ht 5' 10 (1.778 m)   Wt (!) 350 lb (158.8 kg)   SpO2 97%   BMI 50.22 kg/m    Physical Exam Vitals and nursing note reviewed.  Constitutional:      General: He is not in acute distress.    Appearance: Normal appearance.  HENT:     Right Ear: Tympanic membrane normal.     Left Ear: Tympanic membrane normal.     Nose: Nose normal.     Mouth/Throat:     Mouth: Mucous membranes are moist.     Pharynx: Oropharynx is clear.  Eyes:     Extraocular Movements: Extraocular movements intact.  Neck:     Thyroid : No thyroid  tenderness.  Cardiovascular:     Rate and Rhythm: Normal rate and regular rhythm.     Pulses:          Radial pulses are 2+ on the right side and 2+ on the left side.     Heart sounds: Normal heart sounds, S1 normal and S2 normal.  Pulmonary:     Effort:  Pulmonary effort is normal.     Breath sounds: Normal breath sounds.  Abdominal:     General: Bowel sounds are normal.     Palpations: Abdomen is soft.     Tenderness: There is no abdominal tenderness.  Musculoskeletal:        General: Normal range of motion.     Cervical back: Normal range of motion.     Right lower leg: No edema.     Left lower leg: No edema.  Lymphadenopathy:     Cervical:     Right cervical: No superficial cervical adenopathy.    Left cervical: No superficial cervical adenopathy.  Skin:    General: Skin is warm and dry.  Neurological:     General: No focal deficit present.     Mental Status: He is alert. Mental status is at baseline.  Psychiatric:        Mood and Affect: Mood normal.        Behavior:  Behavior normal.        Thought Content: Thought content normal.        Judgment: Judgment normal.      No results found for any visits on 12/17/24.     Assessment & Plan:    Routine Health Maintenance and Physical Exam  Immunization History  Administered Date(s) Administered   Influenza, Seasonal, Injecte, Preservative Fre 09/20/2024   Influenza-Unspecified 10/24/2020   PFIZER(Purple Top)SARS-COV-2 Vaccination 06/02/2020, 06/24/2020, 11/21/2020    Health Maintenance  Topic Date Due   Hepatitis C Screening  Never done   DTaP/Tdap/Td (1 - Tdap) Never done   Colonoscopy  Never done   COVID-19 Vaccine (4 - 2025-26 season) 01/02/2025 (Originally 08/19/2024)   Zoster Vaccines- Shingrix (1 of 2) 03/17/2025 (Originally 11/08/2023)   Pneumococcal Vaccine: 50+ Years (1 of 2 - PCV) 12/17/2025 (Originally 11/07/1992)   Hepatitis B Vaccines 19-59 Average Risk (1 of 3 - 19+ 3-dose series) 12/17/2025 (Originally 11/07/1992)   HEMOGLOBIN A1C  01/04/2025   Diabetic kidney evaluation - Urine ACR  03/27/2025   OPHTHALMOLOGY EXAM  06/26/2025   Diabetic kidney evaluation - eGFR measurement  08/05/2025   FOOT EXAM  12/17/2025   Influenza Vaccine  Completed   HIV  Screening  Completed   HPV VACCINES  Aged Out   Meningococcal B Vaccine  Aged Out    Discussed health benefits of physical activity, and encouraged him to engage in regular exercise appropriate for his age and condition.  Problem List Items Addressed This Visit     Annual physical exam - Primary   Relevant Orders   CBC   Comprehensive metabolic panel with GFR   Hemoglobin A1c   Hepatitis C antibody   Lipid panel   TSH + free T4   Encounter for lipid screening for cardiovascular disease   Relevant Orders   Lipid panel   Encounter for screening for metabolic disorder   Relevant Orders   Comprehensive metabolic panel with GFR   Hemoglobin A1c   TSH + free T4   Screening for deficiency anemia   Relevant Orders   CBC   Screening for viral disease   Relevant Orders   Hepatitis C antibody   Screening for colon cancer   Relevant Orders   Ambulatory referral to Gastroenterology    Routine labs ordered.  HCM reviewed/discussed. Referral for GI placed for colonoscopy. Hep C ordered. Anticipatory guidance regarding healthy weight, lifestyle and choices given. Recommend healthy diet.  Recommend approximately 150 minutes/week of moderate intensity exercise. Resistance training is good for building muscles and for bone health. Muscle mass helps to increase our metabolism and to burn more calories at rest.  Limit alcohol consumption: no more than one drink per day for women and 2 drinks per day for me. Recommend regular dental and vision exams. Always use seatbelt/lap and shoulder restraints. Recommend using smoke alarms and checking batteries at least twice a year. Recommend using sunscreen when outside.  Agrees with plan of care discussed.  Questions answered.      Return in about 1 year (around 12/18/2025) for CPE with labs.     Darice JONELLE Brownie, FNP     [1]  Outpatient Medications Prior to Visit  Medication Sig   amLODipine  (NORVASC ) 5 MG tablet Take 1 tablet (5 mg  total) by mouth daily.   atorvastatin  (LIPITOR) 20 MG tablet TAKE 1 TABLET BY MOUTH EVERY DAY   busPIRone (BUSPAR) 7.5 MG tablet Take 7.5 mg by mouth 3 (three) times  daily.   dapagliflozin  propanediol (FARXIGA ) 5 MG TABS tablet Take 1 tablet (5 mg total) by mouth daily before breakfast.   diclofenac (VOLTAREN) 75 MG EC tablet Take 75 mg by mouth 2 (two) times daily.   hydrochlorothiazide  (HYDRODIURIL ) 25 MG tablet Take 1 tablet (25 mg total) by mouth daily.   lamoTRIgine  (LAMICTAL ) 150 MG tablet Take 150 mg by mouth daily.   metFORMIN  (GLUCOPHAGE ) 500 MG tablet Take 2 tablets (1,000 mg total) by mouth 2 (two) times daily with a meal.   omeprazole  (PRILOSEC) 20 MG capsule TAKE 1 CAPSULE BY MOUTH EVERY DAY   sildenafil  (VIAGRA ) 100 MG tablet TAKE 1 TABLET 1/2 HOUR TO 1 HOUR PRIOR TO INTERCOURSE AS NEEDED. LIMIT USE TO 1/2 TABLET OR 1 TABLET PER 24 HOURS.   tirzepatide  (MOUNJARO ) 12.5 MG/0.5ML Pen Inject 12.5 mg into the skin once a week.   tobramycin (TOBREX) 0.3 % ophthalmic solution Place 1 drop into both eyes every 6 (six) hours.   Vitamin D , Ergocalciferol , (DRISDOL ) 1.25 MG (50000 UNIT) CAPS capsule Take 1 capsule (50,000 Units total) by mouth every 7 (seven) days.   No facility-administered medications prior to visit.   "

## 2024-12-18 ENCOUNTER — Ambulatory Visit: Payer: Self-pay | Admitting: Family Medicine

## 2024-12-18 LAB — HEPATITIS C ANTIBODY: Hep C Virus Ab: NONREACTIVE

## 2024-12-18 LAB — LIPID PANEL
Chol/HDL Ratio: 2.7 ratio (ref 0.0–5.0)
Cholesterol, Total: 128 mg/dL (ref 100–199)
HDL: 48 mg/dL
LDL Chol Calc (NIH): 66 mg/dL (ref 0–99)
Triglycerides: 65 mg/dL (ref 0–149)
VLDL Cholesterol Cal: 14 mg/dL (ref 5–40)

## 2024-12-18 LAB — COMPREHENSIVE METABOLIC PANEL WITH GFR
ALT: 26 IU/L (ref 0–44)
AST: 35 IU/L (ref 0–40)
Albumin: 4.5 g/dL (ref 3.8–4.9)
Alkaline Phosphatase: 71 IU/L (ref 47–123)
BUN/Creatinine Ratio: 13 (ref 9–20)
BUN: 21 mg/dL (ref 6–24)
Bilirubin Total: 1.2 mg/dL (ref 0.0–1.2)
CO2: 23 mmol/L (ref 20–29)
Calcium: 10.1 mg/dL (ref 8.7–10.2)
Chloride: 98 mmol/L (ref 96–106)
Creatinine, Ser: 1.66 mg/dL — ABNORMAL HIGH (ref 0.76–1.27)
Globulin, Total: 3.4 g/dL (ref 1.5–4.5)
Glucose: 92 mg/dL (ref 70–99)
Potassium: 4.6 mmol/L (ref 3.5–5.2)
Sodium: 138 mmol/L (ref 134–144)
Total Protein: 7.9 g/dL (ref 6.0–8.5)
eGFR: 50 mL/min/1.73 — ABNORMAL LOW

## 2024-12-18 LAB — CBC
Hematocrit: 50.9 % (ref 37.5–51.0)
Hemoglobin: 17 g/dL (ref 13.0–17.7)
MCH: 31.8 pg (ref 26.6–33.0)
MCHC: 33.4 g/dL (ref 31.5–35.7)
MCV: 95 fL (ref 79–97)
Platelets: 253 x10E3/uL (ref 150–450)
RBC: 5.34 x10E6/uL (ref 4.14–5.80)
RDW: 12.8 % (ref 11.6–15.4)
WBC: 7.7 x10E3/uL (ref 3.4–10.8)

## 2024-12-18 LAB — TSH+FREE T4
Free T4: 1.7 ng/dL (ref 0.82–1.77)
TSH: 2.64 u[IU]/mL (ref 0.450–4.500)

## 2024-12-18 LAB — HEMOGLOBIN A1C
Est. average glucose Bld gHb Est-mCnc: 134 mg/dL
Hgb A1c MFr Bld: 6.3 % — ABNORMAL HIGH (ref 4.8–5.6)

## 2024-12-20 ENCOUNTER — Other Ambulatory Visit: Payer: Self-pay | Admitting: Family Medicine

## 2024-12-20 DIAGNOSIS — N1831 Chronic kidney disease, stage 3a: Secondary | ICD-10-CM | POA: Insufficient documentation

## 2024-12-20 DIAGNOSIS — I10 Essential (primary) hypertension: Secondary | ICD-10-CM

## 2024-12-20 MED ORDER — LOSARTAN POTASSIUM 100 MG PO TABS: 100.0000 mg | ORAL_TABLET | Freq: Every day | ORAL | 0 refills | Status: DC

## 2024-12-25 ENCOUNTER — Ambulatory Visit: Admitting: Family Medicine

## 2024-12-25 NOTE — Telephone Encounter (Signed)
 Noted  Copied from CRM [id number]. Topic: Appointments - Transfer of Care >> Dec 25, 2024  9:19 AM Willma R wrote: Pt is requesting to transfer FROM: NP Darice Brownie Pt is requesting to transfer TO: Dr Torrence Barrier Reason for requested transfer: provider leaving It is the responsibility of the team the patient would like to transfer to (Dr Torrence Barrier) to reach out to the patient if for any reason this transfer is not acceptable.

## 2024-12-27 NOTE — Progress Notes (Signed)
 Vincent Ruiz                                          MRN: [id number]   12/27/2024   The [facility] Quality Team Specialist reviewed this patient medical record for the purposes of chart review for care gap closure. The following were reviewed: abstraction for care gap closure-controlling blood pressure.    [facility] Quality Team

## 2025-01-01 ENCOUNTER — Encounter: Payer: Self-pay | Admitting: Bariatrics

## 2025-01-01 ENCOUNTER — Ambulatory Visit (INDEPENDENT_AMBULATORY_CARE_PROVIDER_SITE_OTHER): Admitting: Bariatrics

## 2025-01-01 VITALS — BP 119/73 | HR 70 | Ht 69.0 in | Wt 343.0 lb

## 2025-01-01 DIAGNOSIS — N1831 Chronic kidney disease, stage 3a: Secondary | ICD-10-CM

## 2025-01-01 DIAGNOSIS — E1122 Type 2 diabetes mellitus with diabetic chronic kidney disease: Secondary | ICD-10-CM | POA: Diagnosis not present

## 2025-01-01 DIAGNOSIS — Z6841 Body Mass Index (BMI) 40.0 and over, adult: Secondary | ICD-10-CM

## 2025-01-01 DIAGNOSIS — E1165 Type 2 diabetes mellitus with hyperglycemia: Secondary | ICD-10-CM

## 2025-01-01 DIAGNOSIS — Z7985 Long-term (current) use of injectable non-insulin antidiabetic drugs: Secondary | ICD-10-CM

## 2025-01-01 MED ORDER — DAPAGLIFLOZIN PROPANEDIOL 5 MG PO TABS: 5.0000 mg | ORAL_TABLET | Freq: Every day | ORAL | 0 refills | Status: AC

## 2025-01-01 NOTE — Progress Notes (Signed)
 "                                                                                                             WEIGHT SUMMARY AND BIOMETRICS  Weight Lost Since Last Visit: 2lb  Weight Gained Since Last Visit: 0   Vitals BP: 119/73 Pulse Rate: 70 SpO2: 98 %   Anthropometric Measurements Height: 5' 9 (1.753 m) Weight: (!) 343 lb (155.6 kg) BMI (Calculated): 50.63 Weight at Last Visit: 345lb Weight Lost Since Last Visit: 2lb Weight Gained Since Last Visit: 0 Starting Weight: 389lb Total Weight Loss (lbs): 46 lb (20.9 kg)   Body Composition  Body Fat %: 46.2 % Fat Mass (lbs): 158.8 lbs Muscle Mass (lbs): 175.8 lbs Visceral Fat Rating : 33   Other Clinical Data Fasting: no Labs: no Today's Visit #: 10 Starting Date: 03/27/24    OBESITY Elijah is here to discuss his progress with his obesity treatment plan along with follow-up of his obesity related diagnoses.    Nutrition Plan: the Category 4 plan - 65-70% adherence.  Current exercise: none  Interim History:  He is down 2 lbs since his last visit. e he has a stress fracture of the left foot and has a boot.  He will have a follow-up for his left ankle next month next month.  Eating all of the food on the plan., Protein intake is as prescribed, Is not skipping meals, and Water intake is adequate.   Pharmacotherapy: Dossie is on Mounjaro  12.5 mg SQ weekly Adverse side effects: None Hunger is moderately controlled.  Cravings are moderately controlled.  Assessment/Plan:   Type II Diabetes HgbA1c is at goal. Last A1c was 6.3 CBGs: Not checking      Episodes of hypoglycemia: no Medication(s): Mounjaro  12.5 mg SQ weekly  Lab Results  Component Value Date   HGBA1C 6.3 (H) 12/17/2024   HGBA1C 7.2 (H) 07/04/2024   HGBA1C 8.7 (H) 03/27/2024   Lab Results  Component Value Date   LDLCALC 66 12/17/2024   CREATININE 1.66 (H) 12/17/2024   No results found for: GFR  Plan: Continue Mounjaro  12.5 mg SQ  weekly Continue all other medications.  Will keep all carbohydrates low both sweets and starches.  Will continue exercise regimen to 30 to 60 minutes on most days of the week.  Aim for 7 to 9 hours of sleep nightly.  Eat more low glycemic index foods.   Stage 3 (a) chronic kidney disease:  He states that his primary care stopped his amlodipine  and HCTZ and I started him on losartan  for kidney protection.  He is also taking Farxiga .  Plan:  He will follow the recommendations of his PCP. He will continue the Farxiga  prescription written today.  Rx: Farxiga  5 mg daily #30 with no refills. We discussed plant-based protein and I asked him to increase his plant-based protein and decrease his animal-based protein slightly.  Handouts given on protein paring for vegetables and high-protein vegetables. He will keep his water intake high.     Morbid Obesity:  Current BMI BMI (Calculated): 50.63   Pharmacotherapy Plan Continue  Mounjaro  12.5 mg SQ weekly  Liliana is currently in the action stage of change. As such, his goal is to continue with weight loss efforts.  He has agreed to the Category 4 plan.  Exercise goals: All adults should avoid inactivity. Some physical activity is better than none, and adults who participate in any amount of physical activity gain some health benefits. At this time he cannot do any other exercises other than walking which would be limited secondary to his boot and some upper body exercises.   Behavioral modification strategies: increasing lean protein intake, meal planning , increase water intake, better snacking choices, planning for success, increasing vegetables, keep healthy foods in the home, weigh protein portions, measure portion sizes, work on smaller portions, pack lunch for work, and mindful eating.  Eldrick has agreed to follow-up with our clinic in 4 weeks.     Objective:   VITALS: Per patient if applicable, see vitals. GENERAL: Alert and in no  acute distress. CARDIOPULMONARY: No increased WOB. Speaking in clear sentences.  PSYCH: Pleasant and cooperative. Speech normal rate and rhythm. Affect is appropriate. Insight and judgement are appropriate. Attention is focused, linear, and appropriate.  NEURO: Oriented as arrived to appointment on time with no prompting.   Attestation Statements:   This was prepared with the assistance of Engineer, Civil (consulting).  Occasional wrong-word or sound-a-like substitutions may have occurred due to the inherent limitations of voice recognition.   Clayborne Daring, DO    "

## 2025-01-02 ENCOUNTER — Other Ambulatory Visit: Payer: Self-pay | Admitting: Family

## 2025-01-02 DIAGNOSIS — E785 Hyperlipidemia, unspecified: Secondary | ICD-10-CM

## 2025-01-06 ENCOUNTER — Encounter: Admitting: Family Medicine

## 2025-01-07 ENCOUNTER — Other Ambulatory Visit (HOSPITAL_COMMUNITY): Payer: Self-pay

## 2025-01-08 LAB — OPHTHALMOLOGY REPORT-SCANNED

## 2025-01-13 ENCOUNTER — Other Ambulatory Visit: Payer: Self-pay | Admitting: Family Medicine

## 2025-01-13 DIAGNOSIS — I10 Essential (primary) hypertension: Secondary | ICD-10-CM

## 2025-01-13 DIAGNOSIS — N1831 Chronic kidney disease, stage 3a: Secondary | ICD-10-CM

## 2025-01-16 ENCOUNTER — Encounter: Payer: Self-pay | Admitting: Bariatrics

## 2025-01-29 ENCOUNTER — Ambulatory Visit: Admitting: Bariatrics

## 2025-03-21 ENCOUNTER — Ambulatory Visit: Admitting: Family Medicine
